# Patient Record
Sex: Female | Born: 1995 | Race: Black or African American | Hispanic: No | Marital: Single | Smoking: Current some day smoker
Health system: Southern US, Community
[De-identification: ages and names within clinical notes are randomized; demographics above are authoritative.]

## PROBLEM LIST (undated history)

## (undated) DIAGNOSIS — J302 Other seasonal allergic rhinitis: Secondary | ICD-10-CM

## (undated) DIAGNOSIS — F410 Panic disorder [episodic paroxysmal anxiety] without agoraphobia: Secondary | ICD-10-CM

## (undated) DIAGNOSIS — M419 Scoliosis, unspecified: Secondary | ICD-10-CM

## (undated) DIAGNOSIS — Z7289 Other problems related to lifestyle: Secondary | ICD-10-CM

---
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
## 2017-10-24 ENCOUNTER — Other Ambulatory Visit: Payer: Self-pay

## 2017-10-24 ENCOUNTER — Ambulatory Visit (HOSPITAL_COMMUNITY)
Admission: EM | Admit: 2017-10-24 | Discharge: 2017-10-24 | Disposition: A | Discharge status: Home / Self Care | Payer: BLUE CROSS/BLUE SHIELD | Attending: Family Medicine | Admitting: Family Medicine

## 2017-10-24 ENCOUNTER — Encounter (HOSPITAL_COMMUNITY): Payer: Self-pay | Admitting: Emergency Medicine

## 2017-10-24 DIAGNOSIS — J029 Acute pharyngitis, unspecified: Secondary | ICD-10-CM

## 2017-10-24 DIAGNOSIS — H9202 Otalgia, left ear: Secondary | ICD-10-CM

## 2017-10-24 MED ORDER — AMOXICILLIN 875 MG PO TABS: 875.0000 mg | ORAL_TABLET | Freq: Two times a day (BID) | ORAL | 0 refills | Status: AC

## 2017-10-24 NOTE — ED Provider Notes (Signed)
[facility]   [id number] 10/24/17 Arrival Time: 0906  ASSESSMENT & PLAN:  1. Left ear pain   2. Sore throat    Possible early OM. Meds ordered this encounter  Medications  . amoxicillin (AMOXIL) 875 MG tablet    Sig: Take 1 tablet (875 mg total) by mouth 2 (two) times daily for 10 days.    Dispense:  20 tablet    Refill:  0   OTC symptom care as needed. Ensure adequate fluid intake and rest. May f/u with PCP or here as needed.  Reviewed expectations re: course of current medical issues. Questions answered. Outlined signs and symptoms indicating need for more acute intervention. Patient verbalized understanding. After Visit Summary given.   SUBJECTIVE: History from: patient.  Alisha Washington is a 22 y.o. female who presents with complaint of left otalgia; without drainage; without bleeding. Onset gradual, over the past 4-5 days. Mild congestion. Recent cold symptoms: minimal. Fever: no. Overall normal PO intake without n/v. Sick contacts: no. No significant hearing changes reported. No ear trauma. OTC treatment: ear drops without much help.  Social History   Tobacco Use  Smoking Status Current Every Day Smoker  Smokeless Tobacco Never Used    ROS: As per HPI.   OBJECTIVE:  Vitals:   10/24/17 1016  BP: 121/83  Pulse: 83  Resp: 18  Temp: 98.6 F (37 C)  TempSrc: Oral  SpO2: 98%     General appearance: alert; appears fatigued Ear Canal: normal TM: left: mildly erythematous and bulging Throat: irritated and inflammed Neck: supple without LAD Lungs: unlabored respirations, symmetrical air entry; cough: absent; no respiratory distress Skin: warm and dry Psychological: alert and cooperative; normal mood and affect  No Known Allergies  Past Medical History:  Diagnosis Date  . Deliberate self-cutting   . Panic attacks   . Scoliosis   . Seasonal allergies    Family History  Problem Relation Age of Onset  . Diabetes Father    Social History    Socioeconomic History  . Marital status: Single    Spouse name: Not on file  . Number of children: Not on file  . Years of education: Not on file  . Highest education level: Not on file  Occupational History  . Not on file  Social Needs  . Financial resource strain: Not on file  . Food insecurity:    Worry: Not on file    Inability: Not on file  . Transportation needs:    Medical: Not on file    Non-medical: Not on file  Tobacco Use  . Smoking status: Current Every Day Smoker  . Smokeless tobacco: Never Used  Substance and Sexual Activity  . Alcohol use: No  . Drug use: No  . Sexual activity: Not on file  Lifestyle  . Physical activity:    Days per week: Not on file    Minutes per session: Not on file  . Stress: Not on file  Relationships  . Social connections:    Talks on phone: Not on file    Gets together: Not on file    Attends religious service: Not on file    Active member of club or organization: Not on file    Attends meetings of clubs or organizations: Not on file    Relationship status: Not on file  . Intimate partner violence:    Fear of current or ex partner: Not on file    Emotionally abused: Not on file  Physically abused: Not on file    Forced sexual activity: Not on file  Other Topics Concern  . Not on file  Social History Narrative  . Not on file            Mardella Layman, MD 10/24/17 1031

## 2017-10-24 NOTE — ED Triage Notes (Signed)
Left ear pain and throat pain for 4-5 days.  Unknown if any fever

## 2020-10-06 ENCOUNTER — Emergency Department (HOSPITAL_COMMUNITY): Payer: 59

## 2020-10-06 ENCOUNTER — Encounter (HOSPITAL_COMMUNITY): Payer: Self-pay | Admitting: Emergency Medicine

## 2020-10-06 ENCOUNTER — Emergency Department (HOSPITAL_COMMUNITY)
Admission: EM | Admit: 2020-10-06 | Discharge: 2020-10-06 | Disposition: A | Discharge status: Home / Self Care | Payer: 59 | Attending: Emergency Medicine | Admitting: Emergency Medicine

## 2020-10-06 ENCOUNTER — Other Ambulatory Visit: Payer: Self-pay

## 2020-10-06 DIAGNOSIS — F17200 Nicotine dependence, unspecified, uncomplicated: Secondary | ICD-10-CM | POA: Insufficient documentation

## 2020-10-06 DIAGNOSIS — R079 Chest pain, unspecified: Secondary | ICD-10-CM | POA: Diagnosis present

## 2020-10-06 LAB — BASIC METABOLIC PANEL
Anion gap: 10 (ref 5–15)
BUN: 10 mg/dL (ref 6–20)
CO2: 22 mmol/L (ref 22–32)
Calcium: 9.5 mg/dL (ref 8.9–10.3)
Chloride: 105 mmol/L (ref 98–111)
Creatinine, Ser: 0.41 mg/dL — ABNORMAL LOW (ref 0.44–1.00)
GFR, Estimated: >60 mL/min (ref >60)
Glucose, Bld: 113 mg/dL — ABNORMAL HIGH (ref 70–99)
Potassium: 3.9 mmol/L (ref 3.5–5.1)
Sodium: 137 mmol/L (ref 135–145)

## 2020-10-06 LAB — CBC WITH DIFFERENTIAL/PLATELET
Abs Immature Granulocytes: 0.03 10*3/uL (ref 0.00–0.07)
Basophils Absolute: 0.1 10*3/uL (ref 0.0–0.1)
Basophils Relative: 1 %
Eosinophils Absolute: 0.2 10*3/uL (ref 0.0–0.5)
Eosinophils Relative: 2 %
HCT: 41.6 % (ref 36.0–46.0)
Hemoglobin: 13.8 g/dL (ref 12.0–15.0)
Immature Granulocytes: 0 %
Lymphocytes Relative: 42 %
Lymphs Abs: 4.4 10*3/uL — ABNORMAL HIGH (ref 0.7–4.0)
MCH: 29.4 pg (ref 26.0–34.0)
MCHC: 33.2 g/dL (ref 30.0–36.0)
MCV: 88.5 fL (ref 80.0–100.0)
Monocytes Absolute: 0.9 10*3/uL (ref 0.1–1.0)
Monocytes Relative: 8 %
Neutro Abs: 4.9 10*3/uL (ref 1.7–7.7)
Neutrophils Relative %: 47 %
Platelets: 311 10*3/uL (ref 150–400)
RBC: 4.7 MIL/uL (ref 3.87–5.11)
RDW: 12.6 % (ref 11.5–15.5)
WBC: 10.4 10*3/uL (ref 4.0–10.5)
nRBC: 0 % (ref 0.0–0.2)

## 2020-10-06 LAB — I-STAT BETA HCG BLOOD, ED (MC, WL, AP ONLY): I-stat hCG, quantitative: <5 m[IU]/mL (ref <5)

## 2020-10-06 LAB — TROPONIN I (HIGH SENSITIVITY): Troponin I (High Sensitivity): 3 ng/L (ref <18)

## 2020-10-06 NOTE — ED Provider Notes (Signed)
[facility] EMERGENCY DEPARTMENT Provider Note   CSN: [id number] Arrival date & time: 10/06/20  9518     History Chief Complaint  Patient presents with   Chest Pain    Alisha Washington is a 25 y.o. female.  PMH of ETOH use disorder, depression  Patient presents to ED with complaints of chest pain.  She started having them on Saturday. She states that the pains are in the center of her chest and radiate to throat. She describes it as pressure. They last a couple of seconds and then improves. They are intermittent. They are worse when she lays down. She is at rest when she has them. She is not having chest pain while I am speaking with her, she says that she last felt it around 0600. She admits to eating spicy foods last night. She denies it hurting when she takes a deep breath. She denies dizziness, shortness of breath, nausea, vomiting, back pain, abdominal pain. She is not on birth control. She does not have a PCP.   Chest Pain Associated symptoms: no abdominal pain, no cough, no diaphoresis, no dizziness, no fatigue, no fever, no headache, no nausea, no palpitations, no shortness of breath, no vomiting and no weakness       Past Medical History:  Diagnosis Date   Deliberate self-cutting    Panic attacks    Scoliosis    Seasonal allergies     Patient Active Problem List   Diagnosis Date Noted   Severe recurrent major depression without psychotic features (HCC) 11/21/2016   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   ODD (oppositional defiant disorder) 03/19/2015   Alcohol abuse 03/19/2015    History reviewed. No pertinent surgical history.   OB History   No obstetric history on file.     Family History  Problem Relation Age of Onset   Diabetes Father     Social History   Tobacco Use   Smoking status: Every Day   Smokeless tobacco: Never  Substance Use Topics   Alcohol use: No   Drug use: No    Home Medications Prior to  Admission medications   Medication Sig Start Date End Date Taking? Authorizing Provider  Pseudoeph-Doxylamine-DM-APAP (NYQUIL PO) Take by mouth.    [provider]    Allergies    Patient has no known allergies.  Review of Systems   Review of Systems  Constitutional:  Negative for chills, diaphoresis, fatigue and fever.  Eyes:  Negative for visual disturbance.  Respiratory:  Positive for chest tightness. Negative for cough and shortness of breath.   Cardiovascular:  Positive for chest pain. Negative for palpitations and leg swelling.  Gastrointestinal:  Negative for abdominal pain, constipation, diarrhea, nausea and vomiting.  Skin:  Negative for rash and wound.  Neurological:  Negative for dizziness, syncope, weakness, light-headedness and headaches.  All other systems reviewed and are negative.  Physical Exam Updated Vital Signs BP 120/78   Pulse 83   Temp 98.2 F (36.8 C) (Oral)   Resp 12   SpO2 100%   Physical Exam Vitals and nursing note reviewed.  Constitutional:      General: She is not in acute distress.    Appearance: Normal appearance. She is well-developed. She is not ill-appearing, toxic-appearing or diaphoretic.  HENT:     Head: Normocephalic and atraumatic.     Mouth/Throat:     Mouth: Mucous membranes are moist.     Pharynx: Oropharynx is clear. No oropharyngeal exudate  or posterior oropharyngeal erythema.  Eyes:     General: No scleral icterus.       Right eye: No discharge.        Left eye: No discharge.     Conjunctiva/sclera: Conjunctivae normal.  Cardiovascular:     Rate and Rhythm: Normal rate and regular rhythm.     Pulses: Normal pulses.     Heart sounds: Normal heart sounds, S1 normal and S2 normal. No murmur heard.   No friction rub. No gallop.  Pulmonary:     Effort: Pulmonary effort is normal. No respiratory distress.     Breath sounds: Normal breath sounds. No wheezing, rhonchi or rales.  Chest:     Chest wall: No tenderness.   Abdominal:     General: Abdomen is flat. There is no distension.     Palpations: Abdomen is soft. There is no pulsatile mass.     Tenderness: There is no abdominal tenderness. There is no guarding or rebound.  Musculoskeletal:     Right lower leg: No edema.     Left lower leg: No edema.  Skin:    General: Skin is warm and dry.     Coloration: Skin is not jaundiced.     Findings: No bruising, erythema, lesion or rash.  Neurological:     General: No focal deficit present.     Mental Status: She is alert and oriented to person, place, and time.  Psychiatric:        Mood and Affect: Mood normal.        Behavior: Behavior normal.    ED Results / Procedures / Treatments   Labs (all labs ordered are listed, but only abnormal results are displayed) Labs Reviewed  CBC WITH DIFFERENTIAL/PLATELET - Abnormal; Notable for the following components:      Result Value   Lymphs Abs 4.4 (*)    All other components within normal limits  BASIC METABOLIC PANEL - Abnormal; Notable for the following components:   Glucose, Bld 113 (*)    Creatinine, Ser 0.41 (*)    All other components within normal limits  I-STAT BETA HCG BLOOD, ED ([facility], [facility], [facility] ONLY)  TROPONIN I (HIGH SENSITIVITY)    EKG EKG Interpretation  Date/Time:  Monday October 06 2020 02:24:49 EDT Ventricular Rate:  84 PR Interval:  168 QRS Duration: 86 QT Interval:  372 QTC Calculation: 439 R Axis:   71 Text Interpretation: Normal sinus rhythm Normal ECG When compared with ECG of 06/29/2014, No significant change was found Confirmed by Dione Booze ([id number]) on 10/06/2020 2:48:06 AM  Radiology DG Chest 2 View  Result Date: 10/06/2020 CLINICAL DATA:  Chest pain. EXAM: CHEST - 2 VIEW COMPARISON:  Chest radiograph dated 06/29/2014. FINDINGS: The heart size and mediastinal contours are within normal limits. Both lungs are clear. The visualized skeletal structures are unremarkable. IMPRESSION: No active cardiopulmonary disease.  Electronically Signed   By: Elgie Collard M.D.   On: 10/06/2020 02:49    Procedures Procedures   Medications Ordered in ED Medications - No data to display  ED Course  I have reviewed the triage vital signs and the nursing notes.  Pertinent labs & imaging results that were available during my care of the patient were reviewed by me and considered in my medical decision making (see chart for details).    MDM Rules/Calculators/A&P  Patient presents to the ED with complaints of intermittent chest pain for 2 days. Well appearing, Appears to be in no acute distress, vitals are normal. Afebrile. Patient is not currently having chest pain. Exam is largely unremarkable.    I personally reviewed all laboratory work and imaging. Metabolic panel without any acute abnormality specifically kidney function within normal limits and no significant electrolyte abnormalities.  CBC without leukocytosis or significant anemia.  Reviewed Imaging studies: EKG with no evidence of ACS. Chest XR with no evidence of cardiomegaly, pneumonia, or pneumothorax. Patient is not on birth control and has no risk factors for PE. PERC 0. No need for further imaging.   After reviewing previous history, current presentation, labs and imaging, I have low suspicion for an emergent cause of chest pain. I suspect that patient may be having chest pain as a result of GERD. I am referring her to the [facility] because she does not have a PCP.   Portions of this note were generated with Scientist, clinical (histocompatibility and immunogenetics). Dictation errors may occur despite best attempts at proofreading.   Final Clinical Impression(s) / ED Diagnoses Final diagnoses:  Nonspecific chest pain    Rx / DC Orders ED Discharge Orders     None        Claudie Leach, PA-C 10/06/20 1156    Cheryll Cockayne, MD 10/08/20 [phone]

## 2020-10-06 NOTE — ED Triage Notes (Signed)
Pt c/o generalized chest heaviness since last night.  No other symptoms

## 2020-10-06 NOTE — ED Provider Notes (Signed)
Emergency Medicine Provider Triage Evaluation Note  Alisha Washington , a 25 y.o. female  was evaluated in triage.  Pt complains of chest pain.  Began yesterday but worse today.  Described as heaviness in chest, worse with laying down and swallowing.  Denies feeling of burning in the chest/throat.  No cardiac history, former smoker.  Not currently on exogenous estrogens.  No hx of DVT or PE.  Review of Systems  Positive: Chest pain Negative: SOB, fever, cough  Physical Exam  BP (!) 142/100 (BP Location: Right Arm)   Pulse 92   Temp 98.5 F (36.9 C) (Oral)   Resp 16   SpO2 100%   Gen:   Awake, no distress   Resp:  Normal effort  MSK:   Moves extremities without difficulty  Other:    Medical Decision Making  Medically screening exam initiated at 2:16 AM.  Appropriate orders placed.  Alisha Washington was informed that the remainder of the evaluation will be completed by another provider, this initial triage assessment does not replace that evaluation, and the importance of remaining in the ED until their evaluation is complete.  VSS in triage, no tachycardia or hypoxia.  No real risk factors for PE.  Will obtain EKG, labs, CXR.   Garlon Hatchet, PA-C 10/06/20 0225    Nira Conn, MD 10/06/20 0600

## 2020-10-06 NOTE — Discharge Instructions (Addendum)
You were seen in the ED for chest pain.  You labs and imaging were not suggestive of a serious cause of chest pain.  Please make an appointment with the clinic if you continue to have symptoms for further work up until you get established with a PCP.

## 2020-10-09 ENCOUNTER — Ambulatory Visit: Payer: Self-pay | Admitting: Family Medicine

## 2021-03-06 ENCOUNTER — Emergency Department (HOSPITAL_COMMUNITY)
Admission: EM | Admit: 2021-03-06 | Discharge: 2021-03-06 | Disposition: A | Discharge status: Home / Self Care | Payer: Medicaid Other | Attending: Emergency Medicine | Admitting: Emergency Medicine

## 2021-03-06 DIAGNOSIS — K047 Periapical abscess without sinus: Secondary | ICD-10-CM | POA: Insufficient documentation

## 2021-03-06 DIAGNOSIS — K0889 Other specified disorders of teeth and supporting structures: Secondary | ICD-10-CM

## 2021-03-06 DIAGNOSIS — R030 Elevated blood-pressure reading, without diagnosis of hypertension: Secondary | ICD-10-CM

## 2021-03-06 MED ORDER — AMOXICILLIN-POT CLAVULANATE 875-125 MG PO TABS
1.0000 | ORAL_TABLET | Freq: Once | ORAL | Status: AC
  Administered 2021-03-06: 1 via ORAL
  Filled 2021-03-06: qty 1

## 2021-03-06 MED ORDER — FAMOTIDINE 20 MG PO TABS
40.0000 mg | ORAL_TABLET | Freq: Once | ORAL | Status: AC
  Administered 2021-03-06: 40 mg via ORAL
  Filled 2021-03-06: qty 2

## 2021-03-06 MED ORDER — ACETAMINOPHEN 500 MG PO TABS
1000.0000 mg | ORAL_TABLET | Freq: Once | ORAL | Status: AC
Start: 2021-03-06 — End: 2021-03-06
  Administered 2021-03-06: 1000 mg via ORAL
  Filled 2021-03-06: qty 2

## 2021-03-06 MED ORDER — DIPHENHYDRAMINE HCL 25 MG PO CAPS
25.0000 mg | ORAL_CAPSULE | Freq: Once | ORAL | Status: AC
  Administered 2021-03-06: 25 mg via ORAL
  Filled 2021-03-06: qty 1

## 2021-03-06 MED ORDER — IBUPROFEN 400 MG PO TABS
400.0000 mg | ORAL_TABLET | Freq: Once | ORAL | Status: AC
  Administered 2021-03-06: 400 mg via ORAL
  Filled 2021-03-06: qty 1

## 2021-03-06 MED ORDER — AMOXICILLIN-POT CLAVULANATE 875-125 MG PO TABS: 1.0000 | ORAL_TABLET | Freq: Two times a day (BID) | ORAL | 0 refills | Status: DC

## 2021-03-06 NOTE — ED Triage Notes (Signed)
Pt has appt for root canal 2/21. Advises she took benadryl approx 1400, no improvement in swelling

## 2021-03-06 NOTE — ED Triage Notes (Signed)
Pt c/o intermittent dental pain since last May, cavity on R front tooth. Using orajel since yesterday, states she feels she might be allergic. Swelling to top lip, airway intact, denies SHOB, itchiness, no other complaints.

## 2021-03-06 NOTE — ED Provider Triage Note (Signed)
Emergency Medicine Provider Triage Evaluation Note  Alisha Washington , a 26 y.o. female  was evaluated in triage.  Pt complains of swelling of the upper lip.  Patient started taking Orajel secondary to dental cavity yesterday.  She woke up this morning and her upper lip was significantly swollen per the patient.  She took Benadryl with little relief.  No trouble swallowing.  No trouble breathing.  Review of Systems  Positive:  Negative: See above   Physical Exam  BP (!) 155/92 (BP Location: Left Arm)    Pulse (!) 104    Temp 100.2 F (37.9 C) (Oral)    Resp 16    SpO2 97%  Gen:   Awake, no distress   Resp:  Normal effort  MSK:   Moves extremities without difficulty  Other:  Talking in complete sentences.  Airway intact.  Upper lip is mildly swollen.  Medical Decision Making  Medically screening exam initiated at 7:12 PM.  Appropriate orders placed.  Alisha Washington was informed that the remainder of the evaluation will be completed by another provider, this initial triage assessment does not replace that evaluation, and the importance of remaining in the ED until their evaluation is complete.     Alisha Washington [place], [place] 03/06/21 1914

## 2021-03-06 NOTE — Discharge Instructions (Signed)
It was our pleasure to provide your ER care today - we hope that you feel better.  Take augmentin (antibiotic) as prescribed. Warm compresses to sore area. Take acetaminophen or ibuprofen as need for pain.    Follow up with your dentist this coming Monday or Tuesday.  Your blood pressure is high today - follow up with primary care doctor in the next 1-2 weeks.   Return to ER if worse, new symptoms, severe or intractable pain, increased swelling/spreading redness, high fevers, or other concern.

## 2021-03-06 NOTE — ED Provider Notes (Signed)
[facility] EMERGENCY DEPARTMENT Provider Note   CSN: [id number] Arrival date & time: 03/06/21  1828     History  Chief Complaint  Patient presents with   Allergic Reaction   Dental Pain    Alisha Washington is a 26 y.o. female.  Patient c/o dental pain, and now swelling over that same area. Patient indicates hx pain/cavity to tooth #8, and indicates having tooth pain in past week, moderate, dull. States tried orajel to area in past day, and  then states area seemed more swollen - pt was concerned about possible allergic rxn to orajel. Area is painful, not itchy, just above tooth 8 and below right nare externally. No redness to area. No fever/chills, although temp in ED 100.2.    The history is provided by the patient and medical records.  Allergic Reaction Presenting symptoms: no difficulty swallowing and no rash   Dental Pain Associated symptoms: no fever, no headaches and no neck pain       Home Medications Prior to Admission medications   Medication Sig Start Date End Date Taking? Authorizing Provider  Pseudoeph-Doxylamine-DM-APAP (NYQUIL PO) Take by mouth.    [provider]      Allergies    Patient has no known allergies.    Review of Systems   Review of Systems  Constitutional:  Negative for chills and fever.  HENT:  Positive for dental problem. Negative for sore throat and trouble swallowing.   Eyes:  Negative for pain and redness.  Respiratory:  Negative for shortness of breath.   Gastrointestinal:  Negative for vomiting.  Musculoskeletal:  Negative for neck pain.  Skin:  Negative for rash.  Neurological:  Negative for headaches.  Hematological:  Does not bruise/bleed easily.  Psychiatric/Behavioral:  Negative for confusion.    Physical Exam Updated Vital Signs BP (!) 155/92 (BP Location: Left Arm)    Pulse (!) 104    Temp 100.2 F (37.9 C) (Oral)    Resp 16    SpO2 97%  Physical Exam Vitals and nursing note reviewed.   Constitutional:      Appearance: Normal appearance. She is well-developed.  HENT:     Head: Atraumatic.     Nose: Nose normal.     Mouth/Throat:     Mouth: Mucous membranes are moist.     Comments: Mild gum swelling above tooth #8 ?dental abscess. No fluctuance noted to area, or to face. No erythema to face.  No angioedema. Pharynx normal.  Eyes:     General: No scleral icterus.    Conjunctiva/sclera: Conjunctivae normal.  Neck:     Trachea: No tracheal deviation.  Cardiovascular:     Rate and Rhythm: Normal rate.     Pulses: Normal pulses.  Pulmonary:     Effort: Pulmonary effort is normal. No respiratory distress.  Genitourinary:    Comments: No cva tenderness.  Musculoskeletal:        General: No swelling.     Cervical back: Normal range of motion and neck supple. No rigidity. No muscular tenderness.  Lymphadenopathy:     Cervical: No cervical adenopathy.  Skin:    General: Skin is warm and dry.     Findings: No rash.  Neurological:     Mental Status: She is alert.     Comments: Alert, speech normal.   Psychiatric:        Mood and Affect: Mood normal.    ED Results / Procedures / Treatments   Labs (all labs  ordered are listed, but only abnormal results are displayed) Labs Reviewed - No data to display  EKG None  Radiology No results found.  Procedures Procedures    Medications Ordered in ED Medications  amoxicillin-clavulanate (AUGMENTIN) 875-125 MG per tablet 1 tablet (has no administration in time range)  ibuprofen (ADVIL) tablet 400 mg (has no administration in time range)  acetaminophen (TYLENOL) tablet 1,000 mg (has no administration in time range)  diphenhydrAMINE (BENADRYL) capsule 25 mg (25 mg Oral Given 03/06/21 1926)  famotidine (PEPCID) tablet 40 mg (40 mg Oral Given 03/06/21 1926)    ED Course/ Medical Decision Making/ A&P                           Medical Decision Making Problems Addressed: Dental abscess: acute illness or injury with  systemic symptoms    Details: w low grade fever Elevated blood pressure reading: acute illness or injury Pain, dental: acute illness or injury  Risk OTC drugs. Prescription drug management.   Confirmed nkda w pt.   Reviewed nursing notes and prior charts for additional history. Prior external charts/notes, imaging/lab reviewed.  No meds prior to arrival except benadryl.   Suspect dental abscess.   Augmentin po. Ibuprofen po, acetaminophen po.  Pt currently appears stable for d/c.   Pt indicates has local dentist - to f/u there Monday or Tuesday.  Return precautions provided.             Final Clinical Impression(s) / ED Diagnoses Final diagnoses:  None    Rx / DC Orders ED Discharge Orders     None         Cathren Laine, MD 03/06/21 2014

## 2021-03-26 ENCOUNTER — Ambulatory Visit (HOSPITAL_COMMUNITY)
Admission: EM | Admit: 2021-03-26 | Discharge: 2021-03-26 | Disposition: A | Discharge status: Home / Self Care | Payer: 59

## 2021-03-26 DIAGNOSIS — F332 Major depressive disorder, recurrent severe without psychotic features: Secondary | ICD-10-CM | POA: Diagnosis not present

## 2021-03-26 NOTE — ED Triage Notes (Signed)
Pt presents to [facility] requesting an initial assessment. Pt states that she has been having paranoia for the past year on and off but it has gotten worse lately. Pt states that she was diagnosed with depression and ODD when she was 21 but has not been on any medication. Pt states that she does not have a PCP at this time but would like to get set up with a psychiatrist. Pt states that she often times hears whispers but cannot make out what they are saying and she sees "tall figures". Pt states that she is not having any hallucinations at this time. Pt denies SI/HI and AVH at this time. Pt is routine.

## 2021-03-26 NOTE — ED Notes (Signed)
Pt given avs and follow up instructions.

## 2021-03-26 NOTE — Discharge Instructions (Addendum)

## 2021-03-26 NOTE — ED Provider Notes (Signed)
Behavioral Health Urgent Care Medical Screening Exam  Patient Name: Alisha Washington MRN: [id number] Date of Evaluation: 03/26/21 Chief Complaint:   Diagnosis:  Final diagnoses:  Severe recurrent major depression without psychotic features ([place])    History of Present illness: Byrl Washington is a 26 y.o. female. Patient presents voluntarily to [facility] behavioral health for walk-in assessment.    Alisha Washington reports she has been "dealing with a lot of stuff for a while, sometimes I feel numb."  She shares recent stressors include "work bothers me because I am not where I wish I was."  Patient was enrolled in college however stopped at age 60 and has not returned.  She is employed as a Scientist, water quality in [employer].  She has been diagnosed with major depressive disorder, also diagnosed with alcohol abuse.  She is not currently linked with outpatient psychiatry.  She reports she last had medication management approximately 4 years ago, at that time she was prescribed Prozac.  She reports she was not compliant with medication.  She endorses history of inpatient psychiatric hospital stations, most recent hospitalization at [facility] behavioral health in 2018.  Family psychiatric history is unknown.  Patient reports she believes both of her parents may have suffered from depression.  Patient is assessed face-to-face by nurse practitioner.  She is seated in assessment area, no acute distress.  She is alert and oriented, pleasant and cooperative during assessment.   She presents with depressed mood, congruent affect. She denies suicidal and homicidal ideations.  She denies history of suicide attempts. She contracts verbally for safety with this Probation officer. Patient endorses history of nonsuicidal self-harm by scratching with fingernails.  She reports last episode of scratching with fingernails approximately 2 days ago.  She has normal speech and behavior.  She denies both auditory and visual hallucinations  currently.  She reports she has seen "figures out of the corner of my eye or hear whispers."  Last saw figures out of the corner of her eye 2 days ago.  Patient is able to converse coherently with goal-directed thoughts and no distractibility or preoccupation.  She denies paranoia.  Objectively there is no evidence of psychosis/mania or delusional thinking.  Patient resides in [place], she denies access to weapons.  Patient endorses increased sleep and average appetite.  Patient endorses alcohol use, approximately 1 glass of wine per month.  She denies substance use aside from alcohol.  Patient offered support and encouragement.  She declines anyone to contact for collateral information at this time.  Patient educated and verbalizes understanding of mental health resources and other crisis services in the community. She is instructed to call 911 and present to the nearest emergency room should she experience any suicidal/homicidal ideation, auditory/visual/hallucinations, or detrimental worsening of her mental health condition.     Psychiatric Specialty Exam  Presentation  General Appearance:Appropriate for Environment; Casual  Eye Contact:Fair  Speech:Clear and Coherent; Normal Rate  Speech Volume:Normal  Handedness:Right   Mood and Affect  Mood:Depressed  Affect:Congruent; Depressed   Thought Process  Thought Processes:Goal Directed; Linear; Coherent  Descriptions of Associations:Intact  Orientation:Full (Time, Place and Person)  Thought Content:Logical; WDL    Hallucinations:None  Ideas of Reference:None  Suicidal Thoughts:No  Homicidal Thoughts:No   Sensorium  Memory:Immediate Good; Recent Good  Judgment:Good  Insight:Fair   Executive Functions  Concentration:Good  Attention Span:Good  Recall:Good  Fund of Knowledge:Good  Language:Good   Psychomotor Activity  Psychomotor Activity:Normal   Assets  Assets:Communication Skills; Desire for  Improvement; Financial  Resources/Insurance; Housing; Intimacy; Leisure Time; Physical Health; Resilience   Sleep  Sleep:Fair  Number of hours: No data recorded  No data recorded  Physical Exam: Physical Exam Vitals and nursing note reviewed.  Constitutional:      Appearance: Normal appearance. She is well-developed.  HENT:     Head: Normocephalic and atraumatic.     Nose: Nose normal.  Cardiovascular:     Rate and Rhythm: Normal rate.  Pulmonary:     Effort: Pulmonary effort is normal.  Musculoskeletal:        General: Normal range of motion.     Cervical back: Normal range of motion.  Neurological:     Mental Status: She is alert and oriented to person, place, and time.  Psychiatric:        Attention and Perception: Attention and perception normal.        Mood and Affect: Affect normal. Mood is depressed.        Speech: Speech normal.        Behavior: Behavior normal. Behavior is cooperative.        Thought Content: Thought content normal.        Cognition and Memory: Cognition and memory normal.   Review of Systems  Constitutional: Negative.   HENT: Negative.    Eyes: Negative.   Respiratory: Negative.    Cardiovascular: Negative.   Gastrointestinal: Negative.   Genitourinary: Negative.   Musculoskeletal: Negative.   Skin: Negative.   Neurological: Negative.   Endo/Heme/Allergies: Negative.   Psychiatric/Behavioral:  Positive for depression.   Blood pressure 128/79, pulse 100, temperature 98.5 F (36.9 C), temperature source Oral, resp. rate 18, SpO2 96 %. There is no height or weight on file to calculate BMI.  Musculoskeletal: Strength & Muscle Tone: within normal limits Gait & Station: normal Patient leans: N/A   [facility] MSE Discharge Disposition for Follow up and Recommendations: Based on my evaluation the patient does not appear to have an emergency medical condition and can be discharged with resources and follow up care in outpatient services for  Medication Management, Partial Hospitalization Program, and Individual Therapy Patient reviewed with Dr. Serafina Mitchell. Follow-up with outpatient psychiatry, resources provided. A referral has been made on your behalf for intensive outpatient program.  Program administrator will reach out to you with details surrounding initial assessment.   Lucky Rathke, FNP 03/26/2021, 6:16 PM

## 2021-03-27 ENCOUNTER — Telehealth (HOSPITAL_COMMUNITY): Payer: Self-pay | Admitting: Licensed Clinical Social Worker

## 2021-04-01 ENCOUNTER — Telehealth (HOSPITAL_COMMUNITY): Payer: Self-pay | Admitting: Licensed Clinical Social Worker

## 2021-04-01 ENCOUNTER — Other Ambulatory Visit (HOSPITAL_COMMUNITY): Payer: 59 | Attending: Psychiatry | Admitting: Licensed Clinical Social Worker

## 2021-04-01 ENCOUNTER — Other Ambulatory Visit: Payer: Self-pay

## 2021-04-06 ENCOUNTER — Encounter (HOSPITAL_COMMUNITY): Payer: Self-pay | Admitting: Registered Nurse

## 2021-04-06 ENCOUNTER — Other Ambulatory Visit: Payer: Self-pay

## 2021-04-06 ENCOUNTER — Inpatient Hospital Stay (HOSPITAL_COMMUNITY)
Admission: AD | Admit: 2021-04-06 | Discharge: 2021-04-13 | DRG: 885 | Disposition: A | Discharge status: Home / Self Care | Payer: 59 | Source: Intra-hospital | Attending: Psychiatry | Admitting: Psychiatry

## 2021-04-06 ENCOUNTER — Other Ambulatory Visit (HOSPITAL_COMMUNITY): Payer: 59 | Admitting: Licensed Clinical Social Worker

## 2021-04-06 ENCOUNTER — Ambulatory Visit (HOSPITAL_COMMUNITY)
Admission: EM | Admit: 2021-04-06 | Discharge: 2021-04-06 | Disposition: A | Discharge status: Other Acute Inpatient Hospital | Payer: 59 | Attending: Registered Nurse | Admitting: Registered Nurse

## 2021-04-06 DIAGNOSIS — Z833 Family history of diabetes mellitus: Secondary | ICD-10-CM

## 2021-04-06 DIAGNOSIS — R45851 Suicidal ideations: Secondary | ICD-10-CM | POA: Diagnosis not present

## 2021-04-06 DIAGNOSIS — F411 Generalized anxiety disorder: Secondary | ICD-10-CM | POA: Diagnosis present

## 2021-04-06 DIAGNOSIS — Z7251 High risk heterosexual behavior: Secondary | ICD-10-CM | POA: Insufficient documentation

## 2021-04-06 DIAGNOSIS — F913 Oppositional defiant disorder: Secondary | ICD-10-CM | POA: Diagnosis not present

## 2021-04-06 DIAGNOSIS — Z79899 Other long term (current) drug therapy: Secondary | ICD-10-CM

## 2021-04-06 DIAGNOSIS — F332 Major depressive disorder, recurrent severe without psychotic features: Secondary | ICD-10-CM | POA: Diagnosis present

## 2021-04-06 DIAGNOSIS — Z9152 Personal history of nonsuicidal self-harm: Secondary | ICD-10-CM | POA: Diagnosis not present

## 2021-04-06 DIAGNOSIS — F4310 Post-traumatic stress disorder, unspecified: Secondary | ICD-10-CM | POA: Diagnosis present

## 2021-04-06 DIAGNOSIS — Z20822 Contact with and (suspected) exposure to covid-19: Secondary | ICD-10-CM | POA: Diagnosis not present

## 2021-04-06 DIAGNOSIS — F32A Depression, unspecified: Secondary | ICD-10-CM | POA: Diagnosis not present

## 2021-04-06 LAB — LIPID PANEL
Cholesterol: 155 mg/dL (ref 0–200)
HDL: 61 mg/dL (ref >40)
LDL Cholesterol: 84 mg/dL (ref 0–99)
Total CHOL/HDL Ratio: 2.5 RATIO
Triglycerides: 48 mg/dL (ref <150)
VLDL: 10 mg/dL (ref 0–40)

## 2021-04-06 LAB — COMPREHENSIVE METABOLIC PANEL
ALT: 17 U/L (ref 0–44)
AST: 19 U/L (ref 15–41)
Albumin: 4.6 g/dL (ref 3.5–5.0)
Alkaline Phosphatase: 73 U/L (ref 38–126)
Anion gap: 14 (ref 5–15)
BUN: 8 mg/dL (ref 6–20)
CO2: 24 mmol/L (ref 22–32)
Calcium: 9.8 mg/dL (ref 8.9–10.3)
Chloride: 103 mmol/L (ref 98–111)
Creatinine, Ser: 0.59 mg/dL (ref 0.44–1.00)
GFR, Estimated: >60 mL/min (ref >60)
Glucose, Bld: 86 mg/dL (ref 70–99)
Potassium: 3.5 mmol/L (ref 3.5–5.1)
Sodium: 141 mmol/L (ref 135–145)
Total Bilirubin: 1.2 mg/dL (ref 0.3–1.2)
Total Protein: 7.6 g/dL (ref 6.5–8.1)

## 2021-04-06 LAB — URINALYSIS, ROUTINE W REFLEX MICROSCOPIC
Bilirubin Urine: NEGATIVE
Glucose, UA: NEGATIVE mg/dL
Hgb urine dipstick: NEGATIVE
Ketones, ur: 20 mg/dL — AB
Leukocytes,Ua: NEGATIVE
Nitrite: NEGATIVE
Protein, ur: 30 mg/dL — AB
Specific Gravity, Urine: 1.031 — ABNORMAL HIGH (ref 1.005–1.030)
pH: 5 (ref 5.0–8.0)

## 2021-04-06 LAB — POCT URINE DRUG SCREEN - MANUAL ENTRY (I-SCREEN)
POC Amphetamine UR: NOT DETECTED
POC Buprenorphine (BUP): NOT DETECTED
POC Cocaine UR: NOT DETECTED
POC Marijuana UR: POSITIVE — AB
POC Methadone UR: NOT DETECTED
POC Methamphetamine UR: NOT DETECTED
POC Morphine: NOT DETECTED
POC Oxazepam (BZO): NOT DETECTED
POC Oxycodone UR: NOT DETECTED
POC Secobarbital (BAR): NOT DETECTED

## 2021-04-06 LAB — HEMOGLOBIN A1C
Hgb A1c MFr Bld: 5 % (ref 4.8–5.6)
Mean Plasma Glucose: 96.8 mg/dL

## 2021-04-06 LAB — CBC WITH DIFFERENTIAL/PLATELET
Abs Immature Granulocytes: 0.02 10*3/uL (ref 0.00–0.07)
Basophils Absolute: 0.1 10*3/uL (ref 0.0–0.1)
Basophils Relative: 1 %
Eosinophils Absolute: 0.1 10*3/uL (ref 0.0–0.5)
Eosinophils Relative: 1 %
HCT: 42.9 % (ref 36.0–46.0)
Hemoglobin: 14.3 g/dL (ref 12.0–15.0)
Immature Granulocytes: 0 %
Lymphocytes Relative: 31 %
Lymphs Abs: 2.7 10*3/uL (ref 0.7–4.0)
MCH: 28.4 pg (ref 26.0–34.0)
MCHC: 33.3 g/dL (ref 30.0–36.0)
MCV: 85.3 fL (ref 80.0–100.0)
Monocytes Absolute: 0.8 10*3/uL (ref 0.1–1.0)
Monocytes Relative: 9 %
Neutro Abs: 5.1 10*3/uL (ref 1.7–7.7)
Neutrophils Relative %: 58 %
Platelets: 312 10*3/uL (ref 150–400)
RBC: 5.03 MIL/uL (ref 3.87–5.11)
RDW: 12.3 % (ref 11.5–15.5)
WBC: 8.7 10*3/uL (ref 4.0–10.5)
nRBC: 0 % (ref 0.0–0.2)

## 2021-04-06 LAB — RESP PANEL BY RT-PCR (FLU A&B, COVID) ARPGX2
Influenza A by PCR: NEGATIVE
Influenza B by PCR: NEGATIVE
SARS Coronavirus 2 by RT PCR: NEGATIVE

## 2021-04-06 LAB — PREGNANCY, URINE: Preg Test, Ur: NEGATIVE

## 2021-04-06 LAB — ETHANOL: Alcohol, Ethyl (B): <10 mg/dL (ref <10)

## 2021-04-06 LAB — MAGNESIUM: Magnesium: 2.2 mg/dL (ref 1.7–2.4)

## 2021-04-06 LAB — POC SARS CORONAVIRUS 2 AG -  ED: SARS Coronavirus 2 Ag: NEGATIVE

## 2021-04-06 LAB — TSH: TSH: <0.01 u[IU]/mL — ABNORMAL LOW (ref 0.350–4.500)

## 2021-04-06 MED ORDER — ACETAMINOPHEN 325 MG PO TABS: 650.0000 mg | ORAL_TABLET | Freq: Four times a day (QID) | ORAL | Status: DC | PRN

## 2021-04-06 MED ORDER — MAGNESIUM HYDROXIDE 400 MG/5ML PO SUSP: 30.0000 mL | Freq: Every day | ORAL | Status: DC | PRN

## 2021-04-06 MED ORDER — HYDROXYZINE HCL 25 MG PO TABS
25.0000 mg | ORAL_TABLET | Freq: Three times a day (TID) | ORAL | Status: DC | PRN
Start: 2021-04-06 — End: 2021-04-13
  Administered 2021-04-10: 25 mg via ORAL
  Filled 2021-04-06: qty 1
  Filled 2021-04-06: qty 10

## 2021-04-06 MED ORDER — ALUM & MAG HYDROXIDE-SIMETH 200-200-20 MG/5ML PO SUSP: 30.0000 mL | ORAL | Status: DC | PRN

## 2021-04-06 MED ORDER — SERTRALINE HCL 25 MG PO TABS
25.0000 mg | ORAL_TABLET | Freq: Every day | ORAL | Status: DC
  Administered 2021-04-07: 25 mg via ORAL
  Filled 2021-04-06 (×3): qty 1

## 2021-04-06 MED ORDER — HYDROXYZINE HCL 25 MG PO TABS: 25.0000 mg | ORAL_TABLET | Freq: Three times a day (TID) | ORAL | Status: DC | PRN

## 2021-04-06 MED ORDER — TRAZODONE HCL 50 MG PO TABS: 50.0000 mg | ORAL_TABLET | Freq: Every evening | ORAL | Status: DC | PRN

## 2021-04-06 MED ORDER — SERTRALINE HCL 25 MG PO TABS
25.0000 mg | ORAL_TABLET | Freq: Every day | ORAL | Status: DC
  Administered 2021-04-06: 25 mg via ORAL
  Filled 2021-04-06: qty 1

## 2021-04-06 MED ORDER — ALUM & MAG HYDROXIDE-SIMETH 200-200-20 MG/5ML PO SUSP
30.0000 mL | ORAL | Status: DC | PRN
Start: 2021-04-06 — End: 2021-04-13

## 2021-04-06 NOTE — Discharge Instructions (Addendum)
Transfer to [facility] 
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
## 2021-04-06 NOTE — ED Notes (Signed)
Patient refused dinner.

## 2021-04-06 NOTE — ED Provider Notes (Signed)
FBC/OBS ASAP Discharge Summary  Date and Time: 04/06/2021 10:58 PM  Name: Alisha Washington  MRN:  [id number]   Discharge Diagnoses:  Final diagnoses:  Severe recurrent major depression without psychotic features [facility])  Suicidal ideation   Patient transferred to [place] for inpatient psychiatric treatment.   Alisha Washington, 26 y.o., female patient presents to [facility] sent by her counselor after complaints of suicidal ideation with plan and unable to contract for safety   Patient seen face to face by this provider, consulted with Dr. Ernie Hew; and chart reviewed on 04/06/21.  On evaluation Alisha Washington reports she was started on her partial hospitalization program and told counselor that she was having suicidal thoughts.  Patient reports she was then informed by her counselor that she needed to come here [facility]).  Patient states she is having suicidal thoughts with a plan to either cut herself or burned charcoal and breathing it in "because I heard that would kill you to."  Patient also reporting self harming behavior (cutting).  Reports she last cut this morning.  Patient has multiple superficial lacerations anterior and posterior lower forearm.  Reports cuts were made with a box cutter.  Patient denies prior suicide attempt but states she has had 1 psychiatric hospitalization when she was 21 years ago related to getting upset after argument with her boyfriend and hurting herself.  Patient reports she lives alone and she is employed at [employer].  During evaluation Alisha Washington is alert/oriented x 4; calm/cooperative; and mood is congruent with affect.  She does not appear to be responding to internal/external stimuli or delusional thoughts.  Patient denies psychosis, and paranoia; but continues to endorse suicidal ideation with plan and intent.  Patient answered question appropriately.   Recommended for inpatient psychiatric treatment.  Patient reports she is interested in starting medication.  Reports she  has been on Effexor, Lexapro, and Prozac in the past but did not take them long enough to see if any of them would actually work.  Meds ordered this encounter  Medications   acetaminophen (TYLENOL) tablet 650 mg   alum & mag hydroxide-simeth (MAALOX/MYLANTA) 200-200-20 MG/5ML suspension 30 mL   magnesium hydroxide (MILK OF MAGNESIA) suspension 30 mL   traZODone (DESYREL) tablet 50 mg   hydrOXYzine (ATARAX) tablet 25 mg   sertraline (ZOLOFT) tablet 25 mg    Recent Results (from the past 2160 hour(s))  Resp Panel by RT-PCR (Flu A&B, Covid) Nasopharyngeal Swab     Status: None   Collection Time: 04/06/21  1:44 PM   Specimen: Nasopharyngeal Swab; Nasopharyngeal(NP) swabs in vial transport medium  Result Value Ref Range   SARS Coronavirus 2 by RT PCR NEGATIVE NEGATIVE    Comment: (NOTE) SARS-CoV-2 target nucleic acids are NOT DETECTED.  The SARS-CoV-2 RNA is generally detectable in upper respiratory specimens during the acute phase of infection. The lowest concentration of SARS-CoV-2 viral copies this assay can detect is 138 copies/mL. A negative result does not preclude SARS-Cov-2 infection and should not be used as the sole basis for treatment or other patient management decisions. A negative result may occur with  improper specimen collection/handling, submission of specimen other than nasopharyngeal swab, presence of viral mutation(s) within the areas targeted by this assay, and inadequate number of viral copies(<138 copies/mL). A negative result must be combined with clinical observations, patient history, and epidemiological information. The expected result is Negative.  Fact Sheet for Patients:  EntrepreneurPulse.com.au  Fact Sheet for Healthcare Providers:  IncredibleEmployment.be  This test  is no t yet approved or cleared by the [place] and  has been authorized for detection and/or diagnosis of SARS-CoV-2 by FDA under an  Emergency Use Authorization (EUA). This EUA will remain  in effect (meaning this test can be used) for the duration of the COVID-19 declaration under Section 564(b)(1) of the Act, 21 U.S.C.section 360bbb-3(b)(1), unless the authorization is terminated  or revoked sooner.       Influenza A by PCR NEGATIVE NEGATIVE   Influenza B by PCR NEGATIVE NEGATIVE    Comment: (NOTE) The Xpert Xpress SARS-CoV-2/FLU/RSV plus assay is intended as an aid in the diagnosis of influenza from Nasopharyngeal swab specimens and should not be used as a sole basis for treatment. Nasal washings and aspirates are unacceptable for Xpert Xpress SARS-CoV-2/FLU/RSV testing.  Fact Sheet for Patients: EntrepreneurPulse.com.au  Fact Sheet for Healthcare Providers: IncredibleEmployment.be  This test is not yet approved or cleared by the [place] FDA and has been authorized for detection and/or diagnosis of SARS-CoV-2 by FDA under an Emergency Use Authorization (EUA). This EUA will remain in effect (meaning this test can be used) for the duration of the COVID-19 declaration under Section 564(b)(1) of the Act, 21 U.S.C. section 360bbb-3(b)(1), unless the authorization is terminated or revoked.  Performed at [facility], [place] [address] [place], [place] [address]   POC SARS Coronavirus 2 Ag-ED - Nasal Swab     Status: Normal   Collection Time: 04/06/21  1:44 PM  Result Value Ref Range   SARS Coronavirus 2 Ag Negative Negative  CBC with Differential/Platelet     Status: None   Collection Time: 04/06/21  1:47 PM  Result Value Ref Range   WBC 8.7 4.0 - 10.5 K/uL   RBC 5.03 3.87 - 5.11 MIL/uL   Hemoglobin 14.3 12.0 - 15.0 g/dL   HCT 42.9 36.0 - 46.0 %   MCV 85.3 80.0 - 100.0 fL   MCH 28.4 26.0 - 34.0 pg   MCHC 33.3 30.0 - 36.0 g/dL   RDW 12.3 11.5 - 15.5 %   Platelets 312 150 - 400 K/uL   nRBC 0.0 0.0 - 0.2 %   Neutrophils Relative % 58 %   Neutro Abs 5.1 1.7  - 7.7 K/uL   Lymphocytes Relative 31 %   Lymphs Abs 2.7 0.7 - 4.0 K/uL   Monocytes Relative 9 %   Monocytes Absolute 0.8 0.1 - 1.0 K/uL   Eosinophils Relative 1 %   Eosinophils Absolute 0.1 0.0 - 0.5 K/uL   Basophils Relative 1 %   Basophils Absolute 0.1 0.0 - 0.1 K/uL   Immature Granulocytes 0 %   Abs Immature Granulocytes 0.02 0.00 - 0.07 K/uL    Comment: Performed at [facility], [address] [place], [place] [address]  Comprehensive metabolic panel     Status: None   Collection Time: 04/06/21  1:47 PM  Result Value Ref Range   Sodium 141 135 - 145 mmol/L   Potassium 3.5 3.5 - 5.1 mmol/L   Chloride 103 98 - 111 mmol/L   CO2 24 22 - 32 mmol/L   Glucose, Bld 86 70 - 99 mg/dL    Comment: Glucose reference range applies only to samples taken after fasting for at least 8 hours.   BUN 8 6 - 20 mg/dL   Creatinine, Ser 0.59 0.44 - 1.00 mg/dL   Calcium 9.8 8.9 - 10.3 mg/dL   Total Protein 7.6 6.5 - 8.1 g/dL  Albumin 4.6 3.5 - 5.0 g/dL   AST 19 15 - 41 U/L   ALT 17 0 - 44 U/L   Alkaline Phosphatase 73 38 - 126 U/L   Total Bilirubin 1.2 0.3 - 1.2 mg/dL   GFR, Estimated >60 >60 mL/min    Comment: (NOTE) Calculated using the CKD-EPI Creatinine Equation (2021)    Anion gap 14 5 - 15    Comment: Performed at [facility] [address] [place], [place] [address]  Hemoglobin A1c     Status: None   Collection Time: 04/06/21  1:47 PM  Result Value Ref Range   Hgb A1c MFr Bld 5.0 4.8 - 5.6 %    Comment: (NOTE) Pre diabetes:          5.7%-6.4%  Diabetes:              >6.4%  Glycemic control for   <7.0% adults with diabetes    Mean Plasma Glucose 96.8 mg/dL    Comment: Performed at [facility] [address], [place], [place] [address]  Magnesium     Status: None   Collection Time: 04/06/21  1:47 PM  Result Value Ref Range   Magnesium 2.2 1.7 - 2.4 mg/dL    Comment: Performed at [facility] [address], [place], [place] [address]  Ethanol      Status: None   Collection Time: 04/06/21  1:47 PM  Result Value Ref Range   Alcohol, Ethyl (B) <10 <10 mg/dL    Comment: (NOTE) Lowest detectable limit for serum alcohol is 10 mg/dL.  For medical purposes only. Performed at [facility], [place] [address] [place], [place] [address]   Lipid panel     Status: None   Collection Time: 04/06/21  1:47 PM  Result Value Ref Range   Cholesterol 155 0 - 200 mg/dL   Triglycerides 48 <150 mg/dL   HDL 61 >40 mg/dL   Total CHOL/HDL Ratio 2.5 RATIO   VLDL 10 0 - 40 mg/dL   LDL Cholesterol 84 0 - 99 mg/dL    Comment:        Total Cholesterol/HDL:CHD Risk Coronary Heart Disease Risk Table                     Men   Women  1/2 Average Risk   3.4   3.3  Average Risk       5.0   4.4  2 X Average Risk   9.6   7.1  3 X Average Risk  23.4   11.0        Use the calculated Patient Ratio above and the CHD Risk Table to determine the patient's CHD Risk.        ATP III CLASSIFICATION (LDL):  <100     mg/dL   Optimal  100-129  mg/dL   Near or Above                    Optimal  130-159  mg/dL   Borderline  160-189  mg/dL   High  >190     mg/dL   Very High Performed at [facility] [address], [place], [place] [address]   TSH     Status: Abnormal   Collection Time: 04/06/21  1:47 PM  Result Value Ref Range   TSH <0.010 (L) 0.350 - 4.500 uIU/mL    Comment: Performed by a 3rd Generation  assay with a functional sensitivity of <=0.01 uIU/mL. Performed at [facility], [place] [address] [place], [place] [address]   Pregnancy, urine     Status: None   Collection Time: 04/06/21  1:48 PM  Result Value Ref Range   Preg Test, Ur NEGATIVE NEGATIVE    Comment:        THE SENSITIVITY OF THIS METHODOLOGY IS >20 mIU/mL. Performed at [facility], [place] [address] [place], [place] [address]   Urinalysis, Routine w reflex microscopic Urine, Clean Catch     Status: Abnormal   Collection Time: 04/06/21  1:48 PM  Result Value Ref Range    Color, Urine AMBER (A) YELLOW    Comment: BIOCHEMICALS MAY BE AFFECTED BY COLOR   APPearance HAZY (A) CLEAR   Specific Gravity, Urine 1.031 (H) 1.005 - 1.030   pH 5.0 5.0 - 8.0   Glucose, UA NEGATIVE NEGATIVE mg/dL   Hgb urine dipstick NEGATIVE NEGATIVE   Bilirubin Urine NEGATIVE NEGATIVE   Ketones, ur 20 (A) NEGATIVE mg/dL   Protein, ur 30 (A) NEGATIVE mg/dL   Nitrite NEGATIVE NEGATIVE   Leukocytes,Ua NEGATIVE NEGATIVE   RBC / HPF 0-5 0 - 5 RBC/hpf   WBC, UA 0-5 0 - 5 WBC/hpf   Bacteria, UA RARE (A) NONE SEEN   Squamous Epithelial / LPF 0-5 0 - 5   Mucus PRESENT    Hyaline Casts, UA PRESENT     Comment: Performed at [facility], [address] [place], [place] [address]  POCT Urine Drug Screen - (ICup)     Status: Abnormal   Collection Time: 04/06/21  1:53 PM  Result Value Ref Range   POC Amphetamine UR None Detected NONE DETECTED (Cut Off Level 1000 ng/mL)   POC Secobarbital (BAR) None Detected NONE DETECTED (Cut Off Level 300 ng/mL)   POC Buprenorphine (BUP) None Detected NONE DETECTED (Cut Off Level 10 ng/mL)   POC Oxazepam (BZO) None Detected NONE DETECTED (Cut Off Level 300 ng/mL)   POC Cocaine UR None Detected NONE DETECTED (Cut Off Level 300 ng/mL)   POC Methamphetamine UR None Detected NONE DETECTED (Cut Off Level 1000 ng/mL)   POC Morphine None Detected NONE DETECTED (Cut Off Level 300 ng/mL)   POC Oxycodone UR None Detected NONE DETECTED (Cut Off Level 100 ng/mL)   POC Methadone UR None Detected NONE DETECTED (Cut Off Level 300 ng/mL)   POC Marijuana UR Positive (A) NONE DETECTED (Cut Off Level 50 ng/mL)     Past Medical History:  Past Medical History:  Diagnosis Date   Deliberate self-cutting    Panic attacks    Scoliosis    Seasonal allergies    History reviewed. No pertinent surgical history. Family History:  Family History  Problem Relation Age of Onset   Diabetes Father     Social History:  Social History   Substance and Sexual Activity   Alcohol Use No     Social History   Substance and Sexual Activity  Drug Use No    Social History   Socioeconomic History   Marital status: Single    Spouse name: Not on file   Number of children: Not on file   Years of education: Not on file   Highest education level: Not on file  Occupational History   Not on file  Tobacco Use   Smoking status: Every Day   Smokeless tobacco: Never  Substance and Sexual Activity   Alcohol use: No  Drug use: No   Sexual activity: Not on file  Other Topics Concern   Not on file  Social History Narrative   Not on file   Social Determinants of Health   Financial Resource Strain: Not on file  Food Insecurity: Not on file  Transportation Needs: Not on file  Physical Activity: Not on file  Stress: Not on file  Social Connections: Not on file   SDOH:  SDOH Screenings   Alcohol Screen: Not on file  Depression (PHQ2-9): Medium Risk   PHQ-2 Score: 22  Financial Resource Strain: Not on file  Food Insecurity: Not on file  Housing: Not on file  Physical Activity: Not on file  Social Connections: Not on file  Stress: Not on file  Tobacco Use: High Risk   Smoking Tobacco Use: Every Day   Smokeless Tobacco Use: Never   Passive Exposure: Not on file  Transportation Needs: Not on file    Tobacco Cessation:  Prescription not provided because: patient transferred to inpatient psychiatric facility.  Current Medications:  Current Facility-Administered Medications  Medication Dose Route Frequency Provider Last Rate Last Admin   acetaminophen (TYLENOL) tablet 650 mg  650 mg Oral Q6H PRN Rankin, Shuvon B, NP       alum & mag hydroxide-simeth (MAALOX/MYLANTA) 200-200-20 MG/5ML suspension 30 mL  30 mL Oral Q4H PRN Rankin, Shuvon B, NP       hydrOXYzine (ATARAX) tablet 25 mg  25 mg Oral TID PRN Rankin, Shuvon B, NP       magnesium hydroxide (MILK OF MAGNESIA) suspension 30 mL  30 mL Oral Daily PRN Rankin, Shuvon B, NP       sertraline (ZOLOFT)  tablet 25 mg  25 mg Oral Daily Rankin, Shuvon B, NP   25 mg at 04/06/21 1635   traZODone (DESYREL) tablet 50 mg  50 mg Oral QHS PRN Rankin, Shuvon B, NP       No current outpatient medications on file.    PTA Medications:  Prior to Admission medications   Not on File      Musculoskeletal  Strength & Muscle Tone: within normal limits Gait & Station: normal Patient leans: N/A  Psychiatric Specialty Exam  Presentation  General Appearance: Appropriate for Environment  Eye Contact:Fair  Speech:Clear and Coherent; Normal Rate  Speech Volume:Normal  Handedness:Right   Mood and Affect  Mood:Anxious; Depressed  Affect:Congruent; Depressed   Thought Process  Thought Processes:Coherent; Goal Directed  Descriptions of Associations:Intact  Orientation:Full (Time, Place and Person)  Thought Content:WDL  Diagnosis of Schizophrenia or Schizoaffective disorder in past: No    Hallucinations:Hallucinations: None  Ideas of Reference:None  Suicidal Thoughts:Suicidal Thoughts: Yes, Active SI Active Intent and/or Plan: With Intent; With Plan; With Means to [place]  Homicidal Thoughts:Homicidal Thoughts: No   Sensorium  Memory:Immediate Good; Recent Good; Remote Good  Judgment:Fair  Insight:Fair   Executive Functions  Concentration:Good  Attention Span:Good  [place] of Knowledge:Good  Language:Good   Psychomotor Activity  Psychomotor Activity:Psychomotor Activity: Normal   Assets  Assets:Communication Skills; Desire for Improvement; Housing; Catering manager; Physical Health   Sleep  Sleep:Sleep: Fair   Nutritional Assessment (For OBS and FBC admissions only) Has the patient had a weight loss or gain of 10 pounds or more in the last 3 months?: No Has the patient had a decrease in food intake/or appetite?: No Does the patient have dental problems?: No Does the patient have eating habits or behaviors that may be indicators of  an  eating disorder including binging or inducing vomiting?: No Has the patient recently lost weight without trying?: 0 Has the patient been eating poorly because of a decreased appetite?: 0 Malnutrition Screening Tool Score: 0    Physical Exam  Physical Exam HENT:     Right Ear: External ear normal.     Left Ear: External ear normal.  Cardiovascular:     Rate and Rhythm: Normal rate.  Pulmonary:     Effort: Pulmonary effort is normal. No respiratory distress.  Musculoskeletal:        General: Normal range of motion.  Neurological:     Mental Status: She is alert and oriented to person, place, and time.   Review of Systems  Respiratory:  Negative for cough and shortness of breath.   Cardiovascular:  Negative for chest pain.  Gastrointestinal:  Negative for diarrhea, nausea and vomiting.  Blood pressure 122/65, pulse 68, temperature 98.1 F (36.7 C), temperature source Oral, resp. rate 16, SpO2 98 %. There is no height or weight on file to calculate BMI.    Disposition: Transfer to [facility] for inpatient psychiatric treatment  Rozetta Nunnery, NP 04/06/2021, 10:58 PM

## 2021-04-06 NOTE — ED Notes (Signed)
Pt is awake and alert.  She has flat affect and sad mood.  Pt's skin assessment completed and she was oriented to obs unit.  Currently laying down  Quietly.  Staff will continue to monitor for safety

## 2021-04-06 NOTE — Progress Notes (Signed)
Superficial lacerations on left arm. No drainage noted. Old scars on bilateral thighs (lateral).

## 2021-04-06 NOTE — Progress Notes (Addendum)
At the end of CCA, pt was determined to be high risk for suicide. She endorsed SI with means and when asked about intent, stated "Yes but no." Cln informed pt that she needs to be evaluated for potential inpt due to being high risk for suicide. Pt stated was hesitant, stating "I don't know what to say. I'm worried they aren't going to understand what I'm trying to say." Advised pt that cln would contact [facility] staff to inform them of reported symptoms and be available to answer questions, to which pt was agreeable. Because pt lives alone and denies current supports and has to travel by city bus, cln asked pt to remain on the phone while traveling to [facility]. Pt stated her phone needs to be charged and cln consulted with [facility] outpt leadership Everlene Balls for safety planning. Pt agreed to answer her phone in 15 minute increments until she arrives at [facility] and agreed to leave immediately. Cln contacted [facility] staff at 11:16 am to confirm which providers to contact. Cln contacted Falencio Janee Morn, Hillery Jacks, Shuvon Rankin, and Vernard Gambles via secure chat to inform them of concerns and to ensure pt arrives at [facility].  CCA began at 10 am and safety concerns were noted at 11:00 am while pt was still present via webex. Initial safety planning initiated. 11:05 am- pt still present via Webex and [employer] consulted. Pt agrees to plan. 11:27 am- cln called pt and pt confirmed she is at the bus station. 11:43 am- pt stated she is almost at [facility]. Cln informed her she can start PHP tomorrow if she does not meet inpt criteria and cln will send paperwork to [facility]. Pt agreeable. 11:54 am- Falencio Thompson inform cln via secure chat that pt arrived to [facility]. Cln will f/u when disposition is determined by [facility].

## 2021-04-06 NOTE — ED Provider Notes (Signed)
Behavioral Health Admission [facility])  Date: 04/06/21 Patient Name: Alisha Washington MRN: [id number] Chief Complaint:  Chief Complaint  Patient presents with   Depression   Suicidal      Diagnoses:  Final diagnoses:  Severe recurrent major depression without psychotic features ([place])  Suicidal ideation    HPI: Alisha Washington, 26 y.o., female patient presents to [facility] sent by her counselor after complaints of suicidal ideation with plan and unable to contract for safety  Patient seen face to face by this provider, consulted with Dr. Ernie Hew; and chart reviewed on 04/06/21.  On evaluation [name] reports she was started on her partial hospitalization program and told counselor that she was having suicidal thoughts.  Patient reports she was then informed by her counselor that she needed to come here [facility]).  Patient states she is having suicidal thoughts with a plan to either cut herself or burned charcoal and breathing it in "because I heard that would kill you to."  Patient also reporting self harming behavior (cutting).  Reports she last cut this morning.  Patient has multiple superficial lacerations anterior and posterior lower forearm.  Reports cuts were made with a box cutter.  Patient denies prior suicide attempt but states she has had 1 psychiatric hospitalization when she was 21 years ago related to getting upset after argument with her boyfriend and hurting herself.  Patient reports she lives alone and she is employed at [employer].  During evaluation [name] is alert/oriented x 4; calm/cooperative; and mood is congruent with affect.  She does not appear to be responding to internal/external stimuli or delusional thoughts.  Patient denies psychosis, and paranoia; but continues to endorse suicidal ideation with plan and intent.  Patient answered question appropriately.   Recommended for inpatient psychiatric treatment.  Patient reports she is interested in starting medication.   Reports she has been on Effexor, Lexapro, and Prozac in the past but did not take them long enough to see if any of them would actually work.  PHQ 2-9:  Flowsheet Row Counselor from 04/06/2021 in [place]  Thoughts that you would be better off dead, or of hurting yourself in some way Nearly every day  PHQ-9 Total Score 22       Flowsheet Row Counselor from 04/06/2021 in [place] ED from 03/06/2021 in [place] ED from 10/06/2020 in [place] High Risk No Risk No Risk        Total Time spent with patient: 30 minutes  Musculoskeletal  Strength & Muscle Tone: within normal limits Gait & Station: normal Patient leans: N/A  Psychiatric Specialty Exam  Presentation General Appearance: Appropriate for Environment  Eye Contact:Fair  Speech:Clear and Coherent; Normal Rate  Speech Volume:Normal  Handedness:Right   Mood and Affect  Mood:Anxious; Depressed  Affect:Congruent; Depressed   Thought Process  Thought Processes:Coherent; Goal Directed  Descriptions of Associations:Intact  Orientation:Full (Time, Place and Person)  Thought Content:WDL  Diagnosis of Schizophrenia or Schizoaffective disorder in past: No   Hallucinations:Hallucinations: None  Ideas of Reference:None  Suicidal Thoughts:Suicidal Thoughts: Yes, Active SI Active Intent and/or Plan: With Intent; With Plan; With Means to [place]  Homicidal Thoughts:Homicidal Thoughts: No   Sensorium  Memory:Immediate Good; Recent Good; Remote Good  Judgment:Fair  Insight:Fair   Executive Functions  Concentration:Good  Attention Span:Good  Recall:Good  Fund of Knowledge:Good  Language:Good   Psychomotor  Activity  Psychomotor Activity:Psychomotor Activity: Normal   Assets  Assets:Communication Skills; Desire for  Improvement; Housing; Catering manager; Physical Health   Sleep  Sleep:Sleep: Fair   Nutritional Assessment (For OBS and FBC admissions only) Has the patient had a weight loss or gain of 10 pounds or more in the last 3 months?: No Has the patient had a decrease in food intake/or appetite?: No Does the patient have dental problems?: No Does the patient have eating habits or behaviors that may be indicators of an eating disorder including binging or inducing vomiting?: No Has the patient recently lost weight without trying?: 0 Has the patient been eating poorly because of a decreased appetite?: 0 Malnutrition Screening Tool Score: 0    Physical Exam ROS  Blood pressure (!) 145/96, pulse (!) 123, temperature 98.4 F (36.9 C), temperature source Oral, resp. rate 20, SpO2 99 %. There is no height or weight on file to calculate BMI.  Past Psychiatric History: MDD, Anxiety   Is the patient at risk to self? Yes  Has the patient been a risk to self in the past 6 months? No .    Has the patient been a risk to self within the distant past? No   Is the patient a risk to others? No   Has the patient been a risk to others in the past 6 months? No   Has the patient been a risk to others within the distant past? No   Past Medical History:  Past Medical History:  Diagnosis Date   Deliberate self-cutting    Panic attacks    Scoliosis    Seasonal allergies    History reviewed. No pertinent surgical history.  Family History:  Family History  Problem Relation Age of Onset   Diabetes Father     Social History:  Social History   Socioeconomic History   Marital status: Single    Spouse name: Not on file   Number of children: Not on file   Years of education: Not on file   Highest education level: Not on file  Occupational History   Not on file  Tobacco Use   Smoking status: Every Day   Smokeless tobacco: Never  Substance and Sexual Activity   Alcohol use: No    Drug use: No   Sexual activity: Not on file  Other Topics Concern   Not on file  Social History Narrative   Not on file   Social Determinants of Health   Financial Resource Strain: Not on file  Food Insecurity: Not on file  Transportation Needs: Not on file  Physical Activity: Not on file  Stress: Not on file  Social Connections: Not on file  Intimate Partner Violence: Not on file    SDOH:  SDOH Screenings   Alcohol Screen: Not on file  Depression (PHQ2-9): Medium Risk   PHQ-2 Score: 22  Financial Resource Strain: Not on file  Food Insecurity: Not on file  Housing: Not on file  Physical Activity: Not on file  Social Connections: Not on file  Stress: Not on file  Tobacco Use: High Risk   Smoking Tobacco Use: Every Day   Smokeless Tobacco Use: Never   Passive Exposure: Not on file  Transportation Needs: Not on file    Last Labs:  Admission on 10/06/2020, Discharged on 10/06/2020  Component Date Value Ref Range Status   WBC 10/06/2020 10.4  4.0 - 10.5 K/uL Final   RBC 10/06/2020 4.70  3.87 - 5.11 MIL/uL Final  Hemoglobin 10/06/2020 13.8  12.0 - 15.0 g/dL Final   HCT 10/06/2020 41.6  36.0 - 46.0 % Final   MCV 10/06/2020 88.5  80.0 - 100.0 fL Final   MCH 10/06/2020 29.4  26.0 - 34.0 pg Final   MCHC 10/06/2020 33.2  30.0 - 36.0 g/dL Final   RDW 10/06/2020 12.6  11.5 - 15.5 % Final   Platelets 10/06/2020 311  150 - 400 K/uL Final   nRBC 10/06/2020 0.0  0.0 - 0.2 % Final   Neutrophils Relative % 10/06/2020 47  % Final   Neutro Abs 10/06/2020 4.9  1.7 - 7.7 K/uL Final   Lymphocytes Relative 10/06/2020 42  % Final   Lymphs Abs 10/06/2020 4.4 (H)  0.7 - 4.0 K/uL Final   Monocytes Relative 10/06/2020 8  % Final   Monocytes Absolute 10/06/2020 0.9  0.1 - 1.0 K/uL Final   Eosinophils Relative 10/06/2020 2  % Final   Eosinophils Absolute 10/06/2020 0.2  0.0 - 0.5 K/uL Final   Basophils Relative 10/06/2020 1  % Final   Basophils Absolute 10/06/2020 0.1  0.0 - 0.1 K/uL  Final   Immature Granulocytes 10/06/2020 0  % Final   Abs Immature Granulocytes 10/06/2020 0.03  0.00 - 0.07 K/uL Final   Performed at [facility], [place] [address], [place], [place] [address]   Sodium 10/06/2020 137  135 - 145 mmol/L Final   Potassium 10/06/2020 3.9  3.5 - 5.1 mmol/L Final   Chloride 10/06/2020 105  98 - 111 mmol/L Final   CO2 10/06/2020 22  22 - 32 mmol/L Final   Glucose, Bld 10/06/2020 113 (H)  70 - 99 mg/dL Final   Glucose reference range applies only to samples taken after fasting for at least 8 hours.   BUN 10/06/2020 10  6 - 20 mg/dL Final   Creatinine, Ser 10/06/2020 0.41 (L)  0.44 - 1.00 mg/dL Final   Calcium 10/06/2020 9.5  8.9 - 10.3 mg/dL Final   GFR, Estimated 10/06/2020 >60  >60 mL/min Final   Comment: (NOTE) Calculated using the CKD-EPI Creatinine Equation (2021)    Anion gap 10/06/2020 10  5 - 15 Final   Performed at [facility] [address], [place], [place] [address]   Troponin I (High Sensitivity) 10/06/2020 3  <18 ng/L Final   Comment: (NOTE) Elevated high sensitivity troponin I (hsTnI) values and significant  changes across serial measurements may suggest ACS but many other  chronic and acute conditions are known to elevate hsTnI results.  Refer to the "Links" section for chest pain algorithms and additional  guidance. Performed at [facility], [place] [address] [place], [place] [address]    I-stat hCG, quantitative 10/06/2020 <5.0  <5 mIU/mL Final   Comment 3 10/06/2020          Final   Comment:   GEST. AGE      CONC.  (mIU/mL)   <=1 WEEK        5 - 50     2 WEEKS       50 - 500     3 WEEKS       100 - 10,000     4 WEEKS     1,000 - 30,000        FEMALE AND NON-PREGNANT FEMALE:     LESS THAN 5 mIU/mL     Allergies: Patient has no known allergies.  PTA Medications: (Not in a hospital admission)  Medical Decision Making  Patient admitted to continuous assessment unit while awaiting appropriate bed for inpatient  psychiatric treatment. Lab Orders         Resp Panel by RT-PCR (Flu A&B, Covid) Nasopharyngeal Swab         CBC with Differential/Platelet         Comprehensive metabolic panel         Hemoglobin A1c         Magnesium         Ethanol         Lipid panel         TSH         Pregnancy, urine         Urinalysis, Routine w reflex microscopic Urine, Clean Catch         POC SARS Coronavirus 2 Ag-ED - Nasal Swab         POCT Urine Drug Screen - ([facility])      Meds ordered this encounter  Medications   acetaminophen (TYLENOL) tablet 650 mg   alum & mag hydroxide-simeth (MAALOX/MYLANTA) 200-200-20 MG/5ML suspension 30 mL   magnesium hydroxide (MILK OF MAGNESIA) suspension 30 mL   traZODone (DESYREL) tablet 50 mg   hydrOXYzine (ATARAX) tablet 25 mg   sertraline (ZOLOFT) tablet 25 mg       Recommendations  Based on my evaluation the patient does not appear to have an emergency medical condition. Inpatient psychiatric treatment  Earleen Newport, NP 04/06/21  1:06 PM

## 2021-04-06 NOTE — Progress Notes (Addendum)
*  Update Per night AC Oluwatosin, RN pt can arrive at 2150.  Pt was accepted to [facility] today PENDING Negative COVID-19 and labs.  Pt meets inpatient criteria per Assunta Found, NP  Attending Physician will be Dr. Loleta Chance  Report can be called to: --Adult unit: [phone]  [facility] requested that voluntary consent for admission faxed to [phone]. The night shift AC is to coordinate the best time for arrival.   Care Team notified: Ryta Garlan Fair, RN, Darcey Nora, [facility], Ardith Dark, LCSWA, Assunta Found, NP, Bedelia Person, RN, and [facility] Rona Ravens, RN  [place], [place] 04/06/2021 @ 3:06 PM
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
## 2021-04-06 NOTE — ED Notes (Signed)
Pt currently resting quietly.  Breathing even and unlabored.  No distress noted.  Continue to monitor for safety.  °

## 2021-04-06 NOTE — ED Notes (Signed)
Pt is currently sleeping, no distress noted, environmental check complete, will continue to monitor patient for safety. ? ?

## 2021-04-07 ENCOUNTER — Other Ambulatory Visit: Payer: Self-pay

## 2021-04-07 LAB — TSH: TSH: <0.01 u[IU]/mL — ABNORMAL LOW (ref 0.350–4.500)

## 2021-04-07 LAB — GC/CHLAMYDIA PROBE AMP (~~LOC~~) NOT AT ARMC
Chlamydia: NEGATIVE
Comment: NEGATIVE
Comment: NORMAL
Neisseria Gonorrhea: NEGATIVE

## 2021-04-07 LAB — T4, FREE: Free T4: 1.25 ng/dL — ABNORMAL HIGH (ref 0.61–1.12)

## 2021-04-07 MED ORDER — SERTRALINE HCL 50 MG PO TABS
50.0000 mg | ORAL_TABLET | Freq: Every day | ORAL | Status: DC
  Administered 2021-04-08 – 2021-04-09 (×2): 50 mg via ORAL
  Filled 2021-04-07 (×3): qty 1

## 2021-04-07 MED ORDER — NICOTINE 14 MG/24HR TD PT24: 14.0000 mg | MEDICATED_PATCH | Freq: Every day | TRANSDERMAL | Status: DC | PRN

## 2021-04-07 NOTE — [facility] Group Notes (Signed)
Pt attended group and was guarded

## 2021-04-07 NOTE — [facility] Suicide Risk Assessment (Signed)
[facility] INPATIENT:  Family/Significant Other Suicide Prevention Education  Suicide Prevention Education:  Patient Refusal for Family/Significant Other Suicide Prevention Education: The patient Alisha Washington has refused to provide written consent for family/significant other to be provided Family/Significant Other Suicide Prevention Education during admission and/or prior to discharge.  Physician notified.  Metro Kung Dorean Daniello 04/07/2021, 10:06 AM

## 2021-04-07 NOTE — Group Note (Signed)
Recreation Therapy Group Note   Group Topic:Animal Assisted Therapy   Group Date: 04/07/2021 Start Time: 1420 End Time: 1507 Facilitators: Caroll Rancher, LRT,CTRS Location: [address]   AAA/T Program Assumption of Risk Form signed by Patient/ or Parent Legal Guardian YES  Patient is free of allergies or severe asthma  YES  Patient reports no fear of animals YES  Patient reports no history of cruelty to animals YES  Patient understands their participation is voluntary YES  Patient washes hands before animal contact YES  Patient washes hands after animal contact YES   Group Description: Patients provided opportunity to interact with trained and credentialed [employer] Therapy dog and the community volunteer/dog handler. Patients practiced appropriate animal interaction and were educated on dog safety outside of the hospital in common community settings. Patients were allowed to use dog toys and other items to practice commands, engage the dog in play, and/or complete routine aspects of animal care.   Education: Charity fundraiser, Health visitor, Communication & Social Skills    Affect/Mood: Appropriate   Participation Level: Minimal    Clinical Observations/Individualized Feedback: Pt was observant of peers as they interacted with therapy dog.     Plan: Continue to engage patient in RT group sessions 2-3x/week.   Caroll Rancher, Antonietta Jewel 04/07/2021 3:42 PM

## 2021-04-07 NOTE — Tx Team (Signed)
Initial Treatment Plan 04/07/2021 12:09 AM Wayland Denis [id number]    PATIENT STRESSORS: Other: Patient states "none"     PATIENT STRENGTHS: General fund of knowledge  Physical Health  Work skills    PATIENT IDENTIFIED PROBLEMS: Depression  Suicidal thoughts  Self inflicted cutting    "Nothing"             DISCHARGE CRITERIA:  Improved stabilization in mood, thinking, and/or behavior Need for constant or close observation no longer present Reduction of life-threatening or endangering symptoms to within safe limits Verbal commitment to aftercare and medication compliance  PRELIMINARY DISCHARGE PLAN: Outpatient therapy Medication management  PATIENT/FAMILY INVOLVEMENT: This treatment plan has been presented to and reviewed with the patient, Rojean Ige.  The patient and family have been given the opportunity to ask questions and make suggestions.  Levin Bacon, RN 04/07/2021, 12:09 AM

## 2021-04-07 NOTE — Progress Notes (Signed)
Alisha Washington returned to unit early from cafeteria.  "I just didn't want anything."  She went directly to her room and sat on her bed.  During various safety checks, she was noted sitting on the bed with her hair covering her face.  Again offered breakfast and she declined.

## 2021-04-07 NOTE — Progress Notes (Addendum)
Alisha Washington is a 26 year old female being admitted voluntarily to [facility] from [facility].  She was participating in the Partial Hospitalization program and told the counselor that she was having suicidal thoughts.  Her plan was to cut herself or burn charcoal to breath it in.  Counselor recommended that she go to [facility] for an evaluation.  She was unable to provide recent stressors, was very vague and did not want to discuss.  She denied HI or AVH.  During [facility] admission, she was minimal and answered various questions with yes/no or "I don't know."  She appeared guarded and sad.  She was unable to provide symptoms to her depression, "I cant answer that because I am really foggy right now."  After that question, she didn't want to participate with the admission process.  When asked what her goals are for this hospitalization she stated "I don't know, none."  She denied any pain or discomfort and appeared to be in no physical distress.   Admission paperwork completed and signed.  Belongings searched and secured in locker # 13.  Skin assessment completed and superficial self inflicted cuts to anterior and posterior left lower arm.  Old self inflicted scars on bilateral upper thighs.  No contraband found.  Suicide safety plan reviewed, given to patient to complete and return to her nurse.  Q 15 minute checks initiated for safety.

## 2021-04-07 NOTE — [facility] Group Notes (Signed)
PT was informed but di not attend group

## 2021-04-07 NOTE — Progress Notes (Signed)
°   04/07/21 1700  Psych Admission Type (Psych Patients Only)  Admission Status Voluntary  Psychosocial Assessment  Patient Complaints None  Eye Contact Brief  Facial Expression Sad;Flat  Affect Depressed  Speech Soft  Interaction Avoidant;Cautious;Childlike  Motor Activity Slow  Appearance/Hygiene Unremarkable  Behavior Characteristics Appropriate to situation;Cooperative  Mood Depressed;Apprehensive  Aggressive Behavior  Targets Self  Type of Behavior Other (Comment) (cutting)  Effect Self-harm  Thought Process  Coherency WDL  Content WDL  Delusions WDL  Perception WDL  Hallucination None reported or observed  Judgment Impaired  Confusion Mild  Danger to Self  Current suicidal ideation? Passive  Self-Injurious Behavior Some self-injurious ideation observed or expressed.  No lethal plan expressed   Agreement Not to Harm Self Yes  Description of Agreement Verbal agreement to not harm self.  Danger to Others  Danger to Others None reported or observed
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
## 2021-04-08 ENCOUNTER — Telehealth: Payer: Self-pay | Admitting: Emergency Medicine

## 2021-04-08 ENCOUNTER — Encounter (HOSPITAL_COMMUNITY): Payer: Self-pay

## 2021-04-08 LAB — T3: T3, Total: 161 ng/dL (ref 71–180)

## 2021-04-08 MED ORDER — TRAZODONE HCL 50 MG PO TABS
50.0000 mg | ORAL_TABLET | Freq: Every day | ORAL | Status: DC
  Administered 2021-04-08 – 2021-04-09 (×2): 50 mg via ORAL
  Filled 2021-04-08 (×3): qty 1

## 2021-04-08 NOTE — Progress Notes (Signed)
?   04/07/21 2058  ?Psych Admission Type (Psych Patients Only)  ?Admission Status Voluntary  ?Psychosocial Assessment  ?Patient Complaints Anhedonia  ?Eye Contact Brief  ?Facial Expression Sad;Flat  ?Affect Depressed;Sad  ?Speech Soft  ?Interaction Avoidant;Cautious;Isolative;Minimal  ?Motor Activity Slow  ?Appearance/Hygiene Unremarkable  ?Behavior Characteristics Guarded  ?Mood Depressed;Sad  ?Thought Process  ?Coherency WDL  ?Content WDL  ?Delusions WDL  ?Perception WDL  ?Hallucination None reported or observed  ?Judgment Impaired  ?Confusion Mild  ?Danger to Self  ?Current suicidal ideation? Passive  ?Self-Injurious Behavior Some self-injurious ideation observed or expressed.  No lethal plan expressed   ?Agreement Not to Harm Self Yes  ?Description of Agreement Verbal agreement to not harm herself  ?Danger to Others  ?Danger to Others None reported or observed  ? ?Alisha Washington was isolate to her room.  She did not go to evening wrap up group.  She did not come out of her room for snacks.  She continued to report passive SI and verbally agrees not to hurt herself on the unit.  She denied HI or AVH.  She reported her day was 0/10 (10 the best) but wasn't able to explain the answer.  She reported her depression was 8/10 and anxiety 3/10 (10 the worst).  Most of her answer to questions were "I don't know."  Q 15 minute checks maintained for safety. ?

## 2021-04-08 NOTE — Progress Notes (Signed)
[facility] MD Progress Note  04/08/2021 4:02 PM Ravinder Furlough  MRN:  [id number] Subjective:  Patient is a 26 year old female with a past psychiatric history of major depressive disorder, who was admitted to the psychiatric unit from the [facility] for evaluation and treatment of suicidal thoughts with plan to cut herself or burned charcoal to breathe it in.  Chart review from last 24 hours-The patient's chart was reviewed and nursing notes were reviewed. Patient discussed in progression rounds with treatment team. MAR was reviewed and Pt is complaint  with scheduled medications and did not require any PRN medications.    Pt is seen and examined today.  Patient is guarded and does not share much information. She has fair eye contact.  Pt states her mood is same and depressed. She denies any anxiety.  Pt states she slept in the evening time yesterday but woke up around 2:30 AM last night and could not fall asleep.  Pt states her appetite is poor.  She did not talk to any of the family members on phone.  Currently, patient states she is still having suicidal thoughts sometimes.  When asked about plan, patient states "I cannot do anything here".  Pt denies homicidal ideation and, visual and auditory hallucination. She denies paranoia. Pt denies any headache, nausea, vomiting, dizziness, chest pain, SOB, abdominal pain, diarrhea, and constipation. Pt denies any medication side effects and has been tolerating it well.  Discussed that her thyroid hormones were abnormal with TSH low and high T4 indicating hyper active thyroid.  Discussed that we will consult IM to see if we need to start any medications.  She verbalizes understanding.  She is anxious to leave but denies any concerns.  Principal Problem: Severe recurrent major depression ([place]) Diagnosis: Principal Problem:   Severe recurrent major depression ([place])  Total Time spent with patient: 30 minutes I personally spent 30 minutes on the unit in direct patient care. The  direct patient care time included face-to-face time with the patient, reviewing the patient's chart, communicating with other professionals, and coordinating care. Greater than 50% of this time was spent in counseling or coordinating care with the patient regarding goals of hospitalization, psycho-education, and discharge planning needs.  Past Psychiatric History: see H&P  Past Medical History:  Past Medical History:  Diagnosis Date   Deliberate self-cutting    Panic attacks    Scoliosis    Seasonal allergies    History reviewed. No pertinent surgical history. Family History:  Family History  Problem Relation Age of Onset   Diabetes Father    Family Psychiatric  History: See H&P Social History:  Social History   Substance and Sexual Activity  Alcohol Use No     Social History   Substance and Sexual Activity  Drug Use Yes   Types: Marijuana    Social History   Socioeconomic History   Marital status: Single    Spouse name: Not on file   Number of children: Not on file   Years of education: Not on file   Highest education level: Not on file  Occupational History   Not on file  Tobacco Use   Smoking status: Never   Smokeless tobacco: Never  Vaping Use   Vaping Use: Every day   Substances: Nicotine  Substance and Sexual Activity   Alcohol use: No   Drug use: Yes    Types: Marijuana   Sexual activity: Yes    Birth control/protection: Condom  Other Topics Concern   Not on  file  Social History Narrative   Not on file   Social Determinants of Health   Financial Resource Strain: Not on file  Food Insecurity: Not on file  Transportation Needs: Not on file  Physical Activity: Not on file  Stress: Not on file  Social Connections: Not on file   Additional Social History:                         Sleep: Fair slept during the evening time but woke up early at night.  Appetite:  Poor  Current Medications: Current Facility-Administered Medications   Medication Dose Route Frequency Provider Last Rate Last Admin   acetaminophen (TYLENOL) tablet 650 mg  650 mg Oral Q6H PRN Rozetta Nunnery, NP       alum & mag hydroxide-simeth (MAALOX/MYLANTA) 200-200-20 MG/5ML suspension 30 mL  30 mL Oral Q4H PRN Rozetta Nunnery, NP       hydrOXYzine (ATARAX) tablet 25 mg  25 mg Oral TID PRN Rozetta Nunnery, NP       magnesium hydroxide (MILK OF MAGNESIA) suspension 30 mL  30 mL Oral Daily PRN Lindon Romp A, NP       nicotine (NICODERM CQ - dosed in mg/24 hours) patch 14 mg  14 mg Transdermal Daily PRN Massengill, Ovid Curd, MD       sertraline (ZOLOFT) tablet 50 mg  50 mg Oral Daily Massengill, Ovid Curd, MD   50 mg at 04/08/21 [id number]   traZODone (DESYREL) tablet 50 mg  50 mg Oral QHS Armando Reichert, MD        Lab Results:  Results for orders placed or performed during the hospital encounter of 04/06/21 (from the past 48 hour(s))  T4, free     Status: Abnormal   Collection Time: 04/07/21  6:12 AM  Result Value Ref Range   Free T4 1.25 (H) 0.61 - 1.12 ng/dL    Comment: (NOTE) Biotin ingestion may interfere with free T4 tests. If the results are inconsistent with the TSH level, previous test results, or the clinical presentation, then consider biotin interference. If needed, order repeat testing after stopping biotin. Performed at [facility], [place] [address], [place], [place] [address]   TSH     Status: Abnormal   Collection Time: 04/07/21  6:12 AM  Result Value Ref Range   TSH <0.010 (L) 0.350 - 4.500 uIU/mL    Comment: Performed by a 3rd Generation assay with a functional sensitivity of <=0.01 uIU/mL. Performed at [facility], [place] [address] [place],  [address]   T3     Status: None   Collection Time: 04/07/21  6:12 AM  Result Value Ref Range   T3, Total 161 71 - 180 ng/dL    Comment: (NOTE) Performed At: [facility] [place], [place] [id number] Rush Farmer MD [id number]     Blood  Alcohol level:  Lab Results  Component Value Date   [facility] <10 04/06/2021   ETH <10 [id number]    Metabolic Disorder Labs: Lab Results  Component Value Date   HGBA1C 5.0 04/06/2021   MPG 96.8 04/06/2021   MPG 105.41 11/21/2016   No results found for: PROLACTIN Lab Results  Component Value Date   CHOL 155 04/06/2021   TRIG 48 04/06/2021   HDL 61 04/06/2021   CHOLHDL 2.5 04/06/2021   VLDL 10 04/06/2021   LDLCALC 84 04/06/2021   LDLCALC 55 11/21/2016    Physical  Findings: AIMS: Facial and Oral Movements Muscles of Facial Expression: None, normal Lips and Perioral Area: None, normal Jaw: None, normal Tongue: None, normal,Extremity Movements Upper (arms, wrists, hands, fingers): None, normal Lower (legs, knees, ankles, toes): None, normal, Trunk Movements Neck, shoulders, hips: None, normal, Overall Severity Severity of abnormal movements (highest score from questions above): None, normal Incapacitation due to abnormal movements: None, normal Patient's awareness of abnormal movements (rate only patient's report): No Awareness, Dental Status Current problems with teeth and/or dentures?: No Does patient usually wear dentures?: No  CIWA:    COWS:     Musculoskeletal: Strength & Muscle Tone: within normal limits Gait & Station: normal Patient leans: N/A  Psychiatric Specialty Exam:  Presentation  General Appearance: Appropriate for Environment  Eye Contact:Fair  Speech:Normal Rate  Speech Volume:Decreased  Handedness:Right   Mood and Affect  Mood:Anxious; Depressed  Affect:Congruent; Restricted   Thought Process  Thought Processes:Coherent; Linear; Goal Directed Guarded , doesn't share much information.  Descriptions of Associations:Intact  Orientation:Full (Time, Place and Person)  Thought Content:Logical; WDL  History of Schizophrenia/Schizoaffective disorder:No  Duration of Psychotic Symptoms:N/A  Hallucinations:Hallucinations: None  Ideas of  Reference:None  Suicidal Thoughts:Suicidal Thoughts: Yes, Active (No plan) SI Active Intent and/or Plan: Without Plan; Without Means to Carry Out  Homicidal Thoughts:Homicidal Thoughts: No   Sensorium  Memory:Immediate Good; Recent Good; Remote Good  Judgment:Poor  Insight:Poor   Executive Functions  Concentration:Poor  Attention Span:Fair  Recall:Good  Fund of Knowledge:Fair  Language:Fair   Psychomotor Activity  Psychomotor Activity:Psychomotor Activity: Decreased   Assets  Assets:Communication Skills; Desire for Improvement; Housing; Catering manager; Physical Health   Sleep  Sleep:Sleep: Fair    Physical Exam: Physical Exam Vitals and nursing note reviewed.  Constitutional:      General: She is not in acute distress.    Appearance: Normal appearance. She is not ill-appearing, toxic-appearing or diaphoretic.  Pulmonary:     Effort: Pulmonary effort is normal.  Neurological:     General: No focal deficit present.     Mental Status: She is alert and oriented to person, place, and time.   Review of Systems  Constitutional:  Negative for chills and fever.  Respiratory:  Negative for cough and shortness of breath.   Cardiovascular:  Negative for chest pain.  Gastrointestinal:  Negative for abdominal pain, diarrhea, heartburn, nausea and vomiting.  Neurological:  Negative for dizziness and headaches.  Psychiatric/Behavioral:  Positive for depression and suicidal ideas. Negative for hallucinations. The patient is nervous/anxious and has insomnia.   Blood pressure 132/84, pulse 92, temperature 98.4 F (36.9 C), temperature source Oral, resp. rate 14, height 5\' 5"  (1.651 m), weight 90.3 kg, last menstrual period 04/06/2021, SpO2 99 %. Body mass index is 33.12 kg/m.   Treatment Plan Summary:Patient is a 26 year old female with a past psychiatric history of major depressive disorder, who was admitted to the psychiatric unit from the [facility] for  evaluation and treatment of suicidal thoughts with plan to cut herself or burned charcoal to breathe it in. 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 Psychiatric Diagnoses and Treatment:               -Continue Zoloft 50 mg/day, for MDD, GAD, PTSD.  -Change trazodone to 50 mg nightly (scheduled instead of as needed)  3. Medical Issues Being Addressed:              Tobacco Use Disorder             -- Nicotine patch 14mg /24 hours ordered             -- Smoking cessation encouraged  4. Abnormal thyroid labs: Low TSH, elevated T4 -T3 pending -Consulted IM and spoke to Dr Reesa Chew- order TSI, TraB, thyroid antibodies. Recommended  that If abnormal, recommend endocrinology consult and outpatient follow-up.  Otherwise repeat lab work outpatient in 3-4 weeks. -vitals wnl at present time    4. Discharge Planning:              -- Social work and case management to assist with discharge planning and identification of hospital follow-up needs prior to discharge          Armando Reichert, MD PGY2 04/08/2021, 4:02 PM

## 2021-04-08 NOTE — Progress Notes (Signed)
?   04/08/21 0900  ?Psych Admission Type (Psych Patients Only)  ?Admission Status Voluntary  ?Psychosocial Assessment  ?Patient Complaints Depression  ?Eye Contact Brief  ?Facial Expression Sad;Flat  ?Affect Depressed;Sad  ?Speech Soft  ?Interaction Isolative  ?Motor Activity Slow  ?Appearance/Hygiene Unremarkable  ?Behavior Characteristics Guarded  ?Mood Depressed  ?Thought Process  ?Coherency WDL  ?Content WDL  ?Delusions None reported or observed  ?Perception WDL  ?Hallucination None reported or observed  ?Judgment Impaired  ?Confusion Mild  ?Danger to Self  ?Current suicidal ideation? Passive  ?Self-Injurious Behavior Some self-injurious ideation observed or expressed.  No lethal plan expressed   ?Agreement Not to Harm Self Yes  ?Description of Agreement verbal  ?Danger to Others  ?Danger to Others None reported or observed  ? ? ?

## 2021-04-08 NOTE — Group Note (Signed)
LCSW Group Therapy Note ? ?Group Date: 04/08/2021 ?Start Time: 1300 ?End Time: 1400 ? ? ?Type of Therapy and Topic:  Group Therapy: Using "I" Statements ? ?Participation Level:  Active ? ?Description of Group:  ?Patients were asked to provide details of some interpersonal conflicts they have experienced. Patients were then educated about ?I? statements, communication which focuses on feelings or views of the speaker rather than what the other person is doing. T group members were asked to reflect on past conflicts and to provide specific examples for utilizing ?I? statements. ? ?Therapeutic Goals: ? ?Patients will verbalize understanding of ineffective communication and effective communication. ?Patients will be able to empathize with whom they are having conflict. ?Patients will practice effective communication in the form of ?I? statements. ? ? ? ?Summary of Patient Progress:  Alisha Washington shared ways to have conversations to use I statements. The patient was present/active throughout the session and proved open to feedback from CSW and peers. Patient demonstrated good insight into the subject matter, was respectful of peers, and was present throughout the entire session. ? ?Therapeutic Modalities:   ?Cognitive Behavioral Therapy ?Solution-Focused Therapy ? ? ? ?Besan Ketchem E Gustin Zobrist, LCSW ?04/08/2021  2:17 PM   ? ?

## 2021-04-08 NOTE — Group Note (Signed)
Recreation Therapy Group Note ? ? ?Group Topic:Stress Management  ?Group Date: 04/08/2021 ?Start Time: 0930 ?End Time: 0950 ?Facilitators: Victorino Sparrow, LRT,CTRS ?Location: [place] ? ? ?Goal Area(s) Addresses:  ?Patient will actively participate in stress management techniques presented during session.  ?Patient will successfully identify benefit of practicing stress management post d/c.  ? ?Group Description: Guided Imagery. LRT provided education, instruction, and demonstration on practice of visualization via guided imagery. Patient was asked to participate in the technique introduced during session. LRT debriefed including topics of mindfulness, stress management and specific scenarios each patient could use these techniques. Patients were given suggestions of ways to access scripts post d/c and encouraged to explore Youtube and other apps available on smartphones, tablets, and computers. ? ? ? ?Affect/Mood: Appropriate ?  ?Participation Level: Active ?  ?Participation Quality: Independent ?  ?Behavior: Attentive  ?  ?Speech/Thought Process: Focused ?  ?Insight: Good ?  ?Judgement: Good ?  ?Modes of Intervention: Script, Petra Kuba Sounds ?  ?Patient Response to Interventions:  Attentive ?  ?Education Outcome: ? Acknowledges education and In group clarification offered   ? ?Clinical Observations/Individualized Feedback: Pt attended and participated in group session.  ?  ? ?Plan: Continue to engage patient in RT group sessions 2-3x/week. ? ? ?Victorino Sparrow, LRT,CTRS ?04/08/2021 11:43 AM ?

## 2021-04-08 NOTE — Progress Notes (Signed)
26 year old admitted behavioral health for suicidal ideation. ?Medical team received a call due to abnormal thyroid function test.  TSH low, elevated T4.  Patient is overall otherwise asymptomatic at this time. ?We will order TSI, TraB, thyroid antibodies. ?If abnormal, recommend endocrinology consult and outpatient follow-up.  Otherwise repeat lab work outpatient in 3-4 weeks. ? ?Spoke with Dr Leone Haven.  ? ?Please call with any further questions as needed. ? ?Stephania Fragmin MD ?

## 2021-04-08 NOTE — Progress Notes (Signed)
Did not attend wrap up group

## 2021-04-08 NOTE — [facility] Group Notes (Signed)
Adult Psychoeducational Group Note ? ?Date:  04/08/2021 ?Time:  2:39 PM ? ?Group Topic/Focus:  ?Wellness Toolbox:   The focus of this group is to discuss various aspects of wellness, balancing those aspects and exploring ways to increase the ability to experience wellness.  Patients will create a wellness toolbox for use upon discharge. ? ?Participation Level:  Active ? ?Participation Quality:  Attentive ? ?Affect:  Appropriate ? ?Cognitive:  Alert ? ?Insight: Appropriate ? ?Engagement in Group:  Engaged ? ?Modes of Intervention:  Activity ? ?Additional Comments:  Patient attended and participated in the relaxation group activity. ? ?Jearl Klinefelter ?04/08/2021, 2:39 PM ?

## 2021-04-08 NOTE — BH IP Treatment Plan (Signed)
Interdisciplinary Treatment and Diagnostic Plan Update ? ?04/08/2021 ?Time of Session: 9:25am  ?Alisha Washington ?MRN: [id number] ? 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 ?Secondary Diagnoses: Principal Problem: ?  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 ? ?Current Medications:  ?Current Facility-Administered Medications  ?Medication Dose Route Frequency Provider Last Rate Last Admin  ? acetaminophen (TYLENOL) tablet 650 mg  650 mg Oral Q6H PRN Rozetta Nunnery, NP      ? alum & mag hydroxide-simeth (MAALOX/MYLANTA) 200-200-20 MG/5ML suspension 30 mL  30 mL Oral Q4H PRN Rozetta Nunnery, NP      ? hydrOXYzine (ATARAX) tablet 25 mg  25 mg Oral TID PRN Rozetta Nunnery, NP      ? magnesium hydroxide (MILK OF MAGNESIA) suspension 30 mL  30 mL Oral Daily PRN Lindon Romp A, NP      ? nicotine (NICODERM CQ - dosed in mg/24 hours) patch 14 mg  14 mg Transdermal Daily PRN Massengill, Ovid Curd, MD      ? sertraline (ZOLOFT) tablet 50 mg  50 mg Oral Daily Massengill, Ovid Curd, MD   50 mg at 04/08/21 9798  ? traZODone (DESYREL) tablet 50 mg  50 mg Oral QHS Armando Reichert, MD      ? ?PTA Medications: ?No medications prior to admission.  ? ? ?Patient Stressors: Other: Patient states "none"   ? ?Patient Strengths: General fund of knowledge  ?Physical Health  ?Work skills  ? ?Treatment Modalities: Medication Management, Group therapy, Case management,  ?1 to 1 session with clinician, Psychoeducation, Recreational therapy. ? ? 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 ?Short Term Goals: Ability to identify changes in lifestyle to reduce recurrence of condition will improve ?Ability to verbalize feelings will improve ?Ability to disclose and discuss suicidal ideas ?Ability to demonstrate self-control will improve ?Ability to identify and develop effective coping behaviors will improve ?Ability to maintain  clinical measurements within normal limits will improve ?Compliance with prescribed medications will improve ?Ability to identify triggers associated with substance abuse/mental health issues will improve ? ?Medication Management: Evaluate patient's response, side effects, and tolerance of medication regimen. ? ?Therapeutic Interventions: 1 to 1 sessions, Unit Group sessions and Medication administration. ? ?Evaluation of Outcomes: Not Met ? ?Physician Treatment Plan for Secondary Diagnosis: Principal Problem: ?  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 ?Long Term Goal(s): Improvement in symptoms so as ready for discharge  ? ?Short Term Goals: Ability to identify changes in lifestyle to reduce recurrence of condition will improve ?Ability to verbalize feelings will improve ?Ability to disclose and discuss suicidal ideas ?Ability to demonstrate self-control will improve ?Ability to identify and develop effective coping behaviors will improve ?Ability to maintain clinical measurements within normal limits will improve ?Compliance with prescribed medications will improve ?Ability to identify triggers associated with substance abuse/mental health issues will improve    ? ?Medication Management: Evaluate patient's response, side effects, and tolerance of medication regimen. ? ?Therapeutic Interventions: 1 to 1 sessions, Unit Group sessions and Medication administration. ? ?Evaluation of Outcomes: Not Met ? ? 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 ?Short Term Goals: Ability to remain free from injury will improve, Ability to participate in decision making will improve, Ability to verbalize feelings will improve, Ability to disclose and discuss suicidal ideas, and Ability to  identify and develop effective coping behaviors will improve ? ?Medication Management: RN will administer  medications as ordered by provider, will assess and evaluate patient's response and provide education to patient for prescribed medication. RN will report any adverse and/or side effects to prescribing provider. ? ?Therapeutic Interventions: 1 on 1 counseling sessions, Psychoeducation, Medication administration, Evaluate responses to treatment, Monitor vital signs and CBGs as ordered, Perform/monitor CIWA, COWS, AIMS and Fall Risk screenings as ordered, Perform wound care treatments as ordered. ? ?Evaluation of Outcomes: Not Met ? ? 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 ? ?Short Term Goals: Engage patient in aftercare planning with referrals and resources, Increase social support, Increase emotional regulation, Facilitate acceptance of mental health diagnosis and concerns, Identify triggers associated with mental health/substance abuse issues, and Increase skills for wellness and recovery ? ?Therapeutic Interventions: Assess for all discharge needs, 1 to 1 time with Education officer, museum, Explore available resources and support systems, Assess for adequacy in community support network, Educate family and significant other(s) on suicide prevention, Complete Psychosocial Assessment, Interpersonal group therapy. ? ?Evaluation of Outcomes: Not Met ? ? ?Progress in Treatment: ?Attending groups: Yes. ?Participating in groups: Yes. ?Taking medication as prescribed: Yes. ?Toleration medication: Yes. ?Family/Significant other contact made: No, will contact:  Declined Consents  ?Patient understands diagnosis: No. ?Discussing patient identified problems/goals with staff: Yes. ?Medical problems stabilized or resolved: Yes. ?Denies suicidal/homicidal ideation: Yes. ?Issues/concerns per patient self-inventory: No. ? ? ?New problem(s)  identified: No, Describe:  None ? ?New Short Term/Long Term Goal(s): medication stabilization, elimination of SI thoughts, development of comprehensive mental wellness plan.  ? ?Patient Goals: "To go home"  ? ?Discharge Plan or Barriers: Patient recently admitted. CSW will continue to follow and assess for appropriate referrals and possible discharge planning.  ? ?Reason for Continuation of Hospitalization: Anxiety ?Depression ?Medication stabilization ?Suicidal ideation ? ?Estimated Length of Stay: 3 to 5 days  ? ? ?Scribe for Treatment Team: ?Darleen Crocker, Latanya Presser ?04/08/2021 ?2:00 PM ?

## 2021-04-09 LAB — THYROID ANTIBODIES
Thyroglobulin Antibody: 28.4 IU/mL — ABNORMAL HIGH (ref 0.0–0.9)
Thyroperoxidase Ab SerPl-aCnc: 416 IU/mL — ABNORMAL HIGH (ref 0–34)

## 2021-04-09 LAB — THYROID STIMULATING IMMUNOGLOBULIN: Thyroid Stimulating Immunoglob: 0.76 IU/L — ABNORMAL HIGH (ref 0.00–0.55)

## 2021-04-09 LAB — THYROTROPIN RECEPTOR AUTOABS: Thyrotropin Receptor Ab: 2.28 IU/L — ABNORMAL HIGH (ref 0.00–1.75)

## 2021-04-09 MED ORDER — SERTRALINE HCL 50 MG PO TABS
75.0000 mg | ORAL_TABLET | Freq: Every day | ORAL | Status: DC
  Administered 2021-04-10: 75 mg via ORAL
  Filled 2021-04-09 (×2): qty 1

## 2021-04-09 NOTE — Progress Notes (Signed)
[facility] MD Progress Note  04/09/2021 11:33 AM Alisha Washington  MRN:  [id number] Subjective:  Patient is a 26 year old female with a past psychiatric history of major depressive disorder, who was admitted to the psychiatric unit from the [facility] for evaluation and treatment of suicidal thoughts with plan to cut herself or burned charcoal to breathe it in.  Chart review from last 24 hours-The patient's chart was reviewed and nursing notes were reviewed. Patient discussed in progression rounds with treatment team. MAR was reviewed and Pt is complaint  with scheduled medications and did not require any PRN medications.    Pt is seen and examined today.  Patient is still guarded and does not share much information. She has fair eye contact.  Pt states her mood is same and depressed. She denies any anxiety. Pt states she slept well last night.  Pt states her appetite is fair.  She did not talk to any of the family members on phone.  Patient is not future oriented.  When asked how she sees herself 5 years from now.  She states she does not even think that far.  Currently, patient denies any active or passive suicidal thoughts.  She states the last had suicidal thoughts was yesterday afternoon.  She denies having any plan.  Pt denies homicidal ideation and, visual and auditory hallucination. She denies paranoia. Pt denies any headache, nausea, vomiting, dizziness, chest pain, SOB, abdominal pain, diarrhea, and constipation. Pt denies any medication side effects and has been tolerating it well.  She has been attending groups.  Discussed that we consulted IM and they ordered thyroid antibodies.  Discussed that if it is abnormal she will have to follow-up with her PCP. She verbalizes understanding.  She asks if she is prediabetic. I told her that her blood sugar was normal at admission but she should follow-up with her PCP regularly.   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 The direct patient care time included face-to-face time with the patient, reviewing the patient's chart, communicating with other professionals, and coordinating care. Greater than 50% of this time was spent in counseling or coordinating care with the patient regarding goals of hospitalization, psycho-education, and discharge planning needs.  Past Psychiatric History: see H&P  Past Medical History:  Past Medical History:  Diagnosis Date   Deliberate self-cutting    Panic attacks    Scoliosis    Seasonal allergies    History reviewed. No pertinent surgical history. Family History:  Family History  Problem Relation Age of Onset   Diabetes Father    Family Psychiatric  History: See H&P Social History:  Social History   Substance and Sexual Activity  Alcohol Use No     Social History   Substance and Sexual Activity  Drug Use Yes   Types: Marijuana    Social History   Socioeconomic History   Marital status: Single    Spouse name: Not on file   Number of children: Not on file   Years of education: Not on file   Highest education level: Not on file  Occupational History   Not on file  Tobacco Use   Smoking status: Never   Smokeless tobacco: Never  Vaping Use   Vaping Use: Every day   Substances: Nicotine  Substance and Sexual Activity   Alcohol use:  No   Drug use: Yes    Types: Marijuana   Sexual activity: Yes    Birth control/protection: Condom  Other Topics Concern   Not on file  Social History Narrative   Not on file   Social Determinants of Health   Financial Resource Strain: Not on file  Food Insecurity: Not on file  Transportation Needs: Not on file  Physical Activity: Not on file  Stress: Not on file  Social Connections: Not on file   Additional Social History:                          Sleep: Good  Appetite:  Fair  Current Medications: Current Facility-Administered Medications  Medication Dose Route Frequency Provider Last Rate Last Admin   acetaminophen (TYLENOL) tablet 650 mg  650 mg Oral Q6H PRN Rozetta Nunnery, NP       alum & mag hydroxide-simeth (MAALOX/MYLANTA) 200-200-20 MG/5ML suspension 30 mL  30 mL Oral Q4H PRN Rozetta Nunnery, NP       hydrOXYzine (ATARAX) tablet 25 mg  25 mg Oral TID PRN Lindon Romp A, NP       magnesium hydroxide (MILK OF MAGNESIA) suspension 30 mL  30 mL Oral Daily PRN Lindon Romp A, NP       nicotine (NICODERM CQ - dosed in mg/24 hours) patch 14 mg  14 mg Transdermal Daily PRN Massengill, Nathan, MD       sertraline (ZOLOFT) tablet 50 mg  50 mg Oral Daily Massengill, Nathan, MD   50 mg at 04/09/21 [id number]   traZODone (DESYREL) tablet 50 mg  50 mg Oral QHS Armando Reichert, MD   50 mg at 04/08/21 2124    Lab Results:  No results found for this or any previous visit (from the past 46 hour(s)).   Blood Alcohol level:  Lab Results  Component Value Date   ETH <10 04/06/2021   ETH <10 [id number]    Metabolic Disorder Labs: Lab Results  Component Value Date   HGBA1C 5.0 04/06/2021   MPG 96.8 04/06/2021   MPG 105.41 11/21/2016   No results found for: PROLACTIN Lab Results  Component Value Date   CHOL 155 04/06/2021   TRIG 48 04/06/2021   HDL 61 04/06/2021   CHOLHDL 2.5 04/06/2021   VLDL 10 04/06/2021   LDLCALC 84 04/06/2021   LDLCALC 55 11/21/2016    Physical Findings: AIMS: Facial and Oral Movements Muscles of Facial Expression: None, normal Lips and Perioral Area: None, normal Jaw: None, normal Tongue: None, normal,Extremity Movements Upper (arms, wrists, hands, fingers): None, normal Lower (legs, knees, ankles, toes): None, normal, Trunk Movements Neck, shoulders, hips: None, normal, Overall Severity Severity of abnormal movements (highest score from questions above): None, normal Incapacitation due to abnormal  movements: None, normal Patient's awareness of abnormal movements (rate only patient's report): No Awareness, Dental Status Current problems with teeth and/or dentures?: No Does patient usually wear dentures?: No  CIWA:    COWS:     Musculoskeletal: Strength & Muscle Tone: within normal limits Gait & Station: normal Patient leans: N/A  Psychiatric Specialty Exam:  Presentation  General Appearance: Appropriate for Environment  Eye Contact:Fair  Speech:Normal Rate  Speech Volume:Decreased  Handedness:Right   Mood and Affect  Mood:Depressed  Affect:Congruent; Restricted   Thought Process  Thought Processes:Coherent; Linear; Goal Directed Guarded , doesn't share much information.  Descriptions of Associations:Intact  Orientation:Full (Time, Place and Person)  Thought Content:Logical; WDL  History of Schizophrenia/Schizoaffective disorder:No  Duration of Psychotic Symptoms:N/A  Hallucinations:Hallucinations: None  Ideas of Reference:None  Suicidal Thoughts:Suicidal Thoughts: No (Last had yesterday afternoon) SI Active Intent and/or Plan: Without Plan; Without Means to Carry Out  Homicidal Thoughts:Homicidal Thoughts: No   Sensorium  Memory:Immediate Good; Recent Good; Remote Good  Judgment:Poor  Insight:Poor   Executive Functions  Concentration:Fair  Attention Span:Fair  [place] (Does not share much information)   Psychomotor Activity  Psychomotor Activity:Psychomotor Activity: Normal   Assets  Assets:Communication Skills; Desire for Improvement; Financial Resources/Insurance; Physical Health   Sleep  Sleep:Sleep: Good    Physical Exam: Physical Exam Vitals and nursing note reviewed.  Constitutional:      General: She is not in acute distress.    Appearance: Normal appearance. She is not ill-appearing, toxic-appearing or diaphoretic.  Pulmonary:     Effort: Pulmonary effort is normal.   Neurological:     General: No focal deficit present.     Mental Status: She is alert and oriented to person, place, and time.   Review of Systems  Constitutional:  Negative for chills and fever.  Respiratory:  Negative for cough and shortness of breath.   Cardiovascular:  Negative for chest pain.  Gastrointestinal:  Negative for abdominal pain, diarrhea, heartburn, nausea and vomiting.  Neurological:  Negative for dizziness and headaches.  Psychiatric/Behavioral:  Positive for depression and suicidal ideas. Negative for hallucinations. The patient is nervous/anxious and has insomnia.   Blood pressure 138/71, pulse 99, temperature 98.1 F (36.7 C), temperature source Oral, resp. rate 14, height 5\' 5"  (1.651 m), weight 90.3 kg, last menstrual period 04/06/2021, SpO2 99 %. Body mass index is 33.12 kg/m.   Treatment Plan Summary:Patient is a 26 year old female with a past psychiatric history of major depressive disorder, who was admitted to the psychiatric unit from the [facility] for evaluation and treatment of suicidal thoughts with plan to cut herself or burned charcoal to breathe it in.  Patient is still guarded, does not share much information.  Still reporting depression and off-and-on suicidal ideations without a plan.  Patient is not future oriented. Will increase Zoloft to 75 mg tomorrow and  to 100 mg on Saturday.   Daily contact with patient to assess and evaluate symptoms and progress in treatment ASSESSMENT:   Diagnoses / Active Problems: -Major depressive disorder, recurrent, severe, without psychotic features -GAD with panic attacks -PTSD New Labs reviewed: T3 within normal limits, TSI, TraB, thyroid antibodies-pending PLAN: 2. Psychiatric Diagnoses and Treatment:               - Increase Zoloft to 75 mg tomorrow. Will increase to 100 mg on Saturday. for MDD, GAD, PTSD.  -Continue scheduled trazodone  50 mg nightly.              3. Medical Issues Being Addressed:               Tobacco Use Disorder             -- Nicotine patch 14mg /24 hours ordered             -- Smoking cessation encouraged  4. Abnormal thyroid labs: Low TSH, elevated T4, T3 within normal limits -Consulted IM and spoke to Dr Reesa Chew- order TSI, TraB, thyroid antibodies. Recommended  that If abnormal, recommend endocrinology consult and outpatient follow-up.  Otherwise repeat lab work outpatient in 3-4 weeks. -vitals wnl at present time    4. Discharge Planning:              --  Social work and case management to assist with discharge planning and identification of hospital follow-up needs prior to discharge          Armando Reichert, MD PGY2 04/09/2021, 11:33 AM

## 2021-04-09 NOTE — [facility] Group Notes (Signed)
[facility] Group Notes:  (Nursing/MHT/Case Management/Adjunct) ? ?Date:  04/09/2021  ?Time:  8:38 PM ? ?Type of Therapy:   Wrap up ? ?Participation Level:  Active ? ?Participation Quality:  Appropriate and Attentive ? ?Affect:  Appropriate ? ?Cognitive:  Alert and Appropriate ? ?Insight:  Appropriate, Good, and Improving ? ?Engagement in Group:  Developing/Improving ? ?Modes of Intervention:  Discussion ? ?Summary of Progress/Problems: pt attended group. She was in a positive mood. She said she wanted to work on a discharge plan. ? ?Alisha Washington ?04/09/2021, 8:38 PM ?

## 2021-04-09 NOTE — Group Note (Signed)
Date:  04/09/2021 ?Time:  9:54 AM ? ?Group Topic/Focus:  ?Orientation:   The focus of this group is to educate the patient on the purpose and policies of crisis stabilization and provide a format to answer questions about their admission.  The group details unit policies and expectations of patients while admitted. ? ? ? ?Participation Level:  Active ? ?Participation Quality:  Appropriate ? ?Affect:  Appropriate ? ?Cognitive:  Appropriate ? ?Insight: Appropriate ? ?Engagement in Group:  Engaged ? ?Modes of Intervention:  Discussion ? ?Additional Comments:   ? ?Alisha Washington ?04/09/2021, 9:54 AM ? ?

## 2021-04-09 NOTE — Progress Notes (Signed)
DAR NOTE: Patient presents with calm affect and depressed mood.  Denies suicidal thoughts, auditory and visual hallucinations.  Rates depression at 7, hopelessness at 10, and anxiety at 3.  Maintained on routine safety checks.  Medications given as prescribed.  Support and encouragement offered as needed.  Attended group and participated.  States goal for today is "to go home."  Patient visible in milieu with minimal interaction.  Patient is safe on and off the unit.  Offered no complaint.   ? ?

## 2021-04-10 MED ORDER — SERTRALINE HCL 100 MG PO TABS
100.0000 mg | ORAL_TABLET | Freq: Every day | ORAL | Status: DC
  Administered 2021-04-11 – 2021-04-13 (×3): 100 mg via ORAL
  Filled 2021-04-10 (×4): qty 1
  Filled 2021-04-10: qty 7

## 2021-04-10 MED ORDER — PROPRANOLOL HCL 10 MG PO TABS
10.0000 mg | ORAL_TABLET | Freq: Two times a day (BID) | ORAL | Status: DC
Start: 2021-04-10 — End: 2021-04-13
  Administered 2021-04-10 – 2021-04-13 (×4): 10 mg via ORAL
  Filled 2021-04-10 (×2): qty 1
  Filled 2021-04-10: qty 14
  Filled 2021-04-10 (×3): qty 1
  Filled 2021-04-10: qty 14
  Filled 2021-04-10 (×4): qty 1

## 2021-04-10 MED ORDER — TRAZODONE HCL 100 MG PO TABS
100.0000 mg | ORAL_TABLET | Freq: Every day | ORAL | Status: DC
Start: 2021-04-10 — End: 2021-04-13
  Administered 2021-04-10 – 2021-04-12 (×3): 100 mg via ORAL
  Filled 2021-04-10 (×2): qty 1
  Filled 2021-04-10: qty 7
  Filled 2021-04-10 (×2): qty 1

## 2021-04-10 NOTE — Group Note (Signed)
[facility] LCSW Group Therapy Note ? ?Date/Time: 04/10/2021 @ 1pm ? ?Type of Therapy and Topic:  Group Therapy:  Who Am I?  Self Esteem, Self-Actualization and Understanding Self. ? ?Participation Level:  Active  ? ?Description of Group:   ? In this group patients will be asked to explore values, beliefs, truths, and morals as they relate to personal self.  Patients will be guided to discuss their thoughts, feelings, and behaviors related to what they identify as important to their true self. Patients will process together how values, beliefs and truths are connected to specific choices patients make every day. Each patient will be challenged to identify changes that they are motivated to make in order to improve self-esteem and self-actualization. This group will be process-oriented, with patients participating in exploration of their own experiences as well as giving and receiving support and challenge from other group members. ? ?Therapeutic Goals: ?Patient will identify false beliefs that currently interfere with their self-esteem.  ?Patient will identify feelings, thought process, and behaviors related to self and will become aware of the uniqueness of themselves and of others.  ?Patient will be able to identify and verbalize values, morals, and beliefs as they relate to self. ?Patient will begin to learn how to build self-esteem/self-awareness by expressing what is important and unique to them personally. ? ?Summary of Patient Progress: Patient participated appropriately in group. Patient identified sister as an important person in her life. Patient participated in discussion questions associated with group topic and shared values important to her.  ? ? ? ? ?Therapeutic Modalities:   ?Cognitive Behavioral Therapy ?Solution Focused Therapy ?Motivational Interviewing ?Brief Therapy ? ? ?Lorence Nagengast, LCSW, LCAS ?Clincal Social Worker  ?[facility] ? ? ?

## 2021-04-10 NOTE — Group Note (Signed)
Date:  04/10/2021 ?Time:  10:03 AM ? ?Group Topic/Focus:  ?Orientation:   The focus of this group is to educate the patient on the purpose and policies of crisis stabilization and provide a format to answer questions about their admission.  The group details unit policies and expectations of patients while admitted. ? ? ? ?Participation Level:  Active ? ?Participation Quality:  Appropriate ? ?Affect:  Appropriate ? ?Cognitive:  Appropriate ? ?Insight: Appropriate ? ?Engagement in Group:  Engaged ? ?Modes of Intervention:  Discussion ? ?Additional Comments: Engaged in self reporting and goal setting. ? ?Jerrye Beavers ?04/10/2021, 10:03 AM ? ?

## 2021-04-10 NOTE — Progress Notes (Shared)
Patient ID: Alisha Washington, female   DOB: Jan 22, 1996, 26 y.o.   MRN: [id number] ? ?Subjective: Patient is a 26 year old female with a past psychiatric history of major depressive disorder, who was admitted to the psychiatric unit from the [facility] for evaluation and treatment of suicidal thoughts with plan to cut herself or burned charcoal to breathe it in. ? ?Chart review from last 24 hours: The patient's chart was reviewed and nursing notes were reviewed. Patient discussed in progression rounds with treatment team. MAR was reviewed and Pt is complaint with scheduled medications and did not require any PRN medications. ? ?Patient is seen and examined today. She is still guarded and does not share much information. She has fair eye contact. She states that her mood is better and she cannot identify a reason why. She reports that her sleep was not great last night. Currently patient denies any active or passive suicidal thoughts. She reports that she had suicidal ideation this morning but those quickly went away. She denies having any plan. Pt denies homicidal ideation, visual and auditory hallucinations, or delusions of paranoia. She states that she is anxious and reports the hospital stay as her source of anxiety. She denies any side effects to medications and has been tolerating them well. Pt reports that her appetite is better. She has been attending and participating in groups. Pt identifies several coping skills including watching TV, doing crossword puzzles, and other activities to keep her mind preoccupied. Discussed other coping skills such as writing, doing puzzles, and reading. Pt has not talked to anyone on the phone. Yesterday, pt was asked to write down and share five things that she loves about herself. When asked to read them out loud, she began to cry because there were too many people in the room. Discussed following-up with her one-on-one later this afternoon. Discussed that we are adjusting her medications  and are considering increasing her Trazodone and adding a medication for anxiety. ?

## 2021-04-10 NOTE — Progress Notes (Signed)
?   04/10/21 0805  ?Psych Admission Type (Psych Patients Only)  ?Admission Status Voluntary  ?Psychosocial Assessment  ?Patient Complaints Apathy  ?Eye Contact Fair  ?Facial Expression Flat  ?Affect Anxious  ?Speech Logical/coherent  ?Interaction Cautious  ?Motor Activity Slow  ?Appearance/Hygiene Unremarkable  ?Behavior Characteristics Cooperative  ?Mood Depressed  ?Thought Process  ?Coherency WDL  ?Content WDL  ?Delusions None reported or observed  ?Perception WDL  ?Hallucination None reported or observed  ?Judgment Impaired  ?Confusion None  ?Danger to Self  ?Current suicidal ideation? Denies  ?Danger to Others  ?Danger to Others None reported or observed  ? ? ?

## 2021-04-10 NOTE — Progress Notes (Signed)
?   04/10/21 2128  ?Psych Admission Type (Psych Patients Only)  ?Admission Status Voluntary  ?Psychosocial Assessment  ?Patient Complaints Anxiety  ?Eye Contact Fair  ?Facial Expression Other (Comment) ?(Appropriate)  ?Affect Appropriate to circumstance  ?Speech Logical/coherent  ?Interaction Assertive  ?Motor Activity Other (Comment) ?(WDL)  ?Appearance/Hygiene Unremarkable  ?Behavior Characteristics Cooperative;Appropriate to situation  ?Mood Depressed  ?Thought Process  ?Coherency WDL  ?Content WDL  ?Delusions None reported or observed  ?Perception WDL  ?Hallucination None reported or observed  ?Judgment Impaired  ?Confusion None  ?Danger to Self  ?Current suicidal ideation? Denies  ?Self-Injurious Behavior No self-injurious ideation or behavior indicators observed or expressed   ?Agreement Not to Harm Self Yes  ?Description of Agreement Verbal contract  ?Danger to Others  ?Danger to Others None reported or observed  ? ? ?

## 2021-04-10 NOTE — Progress Notes (Signed)
[facility] MD Progress Note  04/10/2021 3:31 PM Narely Nobles  MRN:  [id number] Subjective:  Patient is a 26 year old female with a past psychiatric history of major depressive disorder, who was admitted to the psychiatric unit from the [facility] for evaluation and treatment of suicidal thoughts with plan to cut herself or burned charcoal to breathe it in.  Chart review from last 24 hours: The patient's chart was reviewed and nursing notes were reviewed. Patient discussed in progression rounds with treatment team. MAR was reviewed and Pt is complaint with scheduled medications and did not require any PRN medications.   Patient is seen and examined today. She is still guarded and does not share much information. She has fair eye contact. She states that her mood is better and she cannot identify a reason why. She reports that her sleep was not great last night. Currently patient denies any active or passive suicidal thoughts. She reports that she had suicidal ideation this morning but those quickly went away. She denies having any plan. Pt denies homicidal ideation, visual and auditory hallucinations, or delusions of paranoia. She states that she is anxious and reports the hospital stay as her source of anxiety. She denies any side effects to medications and has been tolerating them well. Pt reports that her appetite is better. She has been attending and participating in groups. Pt identifies several coping skills including watching TV, doing crossword puzzles, and other activities to keep her mind preoccupied. Discussed other coping skills such as writing, doing puzzles, and reading. Pt has not talked to anyone on the phone. Yesterday, pt was asked to write down and share five things that she loves about herself. When asked to read them out loud, she began to cry because there were too many people in the room. Discussed following-up with her one-on-one later this afternoon. Discussed that we are adjusting her medications and  are considering increasing her Trazodone and adding a medication for anxiety.  Met with patient again in the afternoon.-Patient shares that her 5 positive things are her hair, freckles, that she cares about people, she is nice and her handwriting.  She states that she was feeling anxious earlier and she does not like talking to multiple people.   Principal Problem: Severe recurrent major depression ([place]) Diagnosis: Principal Problem:   Severe recurrent major depression ([place])  Total Time spent with patient: 30 minutes I personally spent 30 minutes on the unit in direct patient care. The direct patient care time included face-to-face time with the patient, reviewing the patient's chart, communicating with other professionals, and coordinating care. Greater than 50% of this time was spent in counseling or coordinating care with the patient regarding goals of hospitalization, psycho-education, and discharge planning needs.  Past Psychiatric History: see H&P  Past Medical History:  Past Medical History:  Diagnosis Date   Deliberate self-cutting    Panic attacks    Scoliosis    Seasonal allergies    History reviewed. No pertinent surgical history. Family History:  Family History  Problem Relation Age of Onset   Diabetes Father    Family Psychiatric  History: See H&P Social History:  Social History   Substance and Sexual Activity  Alcohol Use No     Social History   Substance and Sexual Activity  Drug Use Yes   Types: Marijuana    Social History   Socioeconomic History   Marital status: Single    Spouse name: Not on file   Number of children: Not on  file   Years of education: Not on file   Highest education level: Not on file  Occupational History   Not on file  Tobacco Use   Smoking status: Never   Smokeless tobacco: Never  Vaping Use   Vaping Use: Every day   Substances: Nicotine  Substance and Sexual Activity   Alcohol use: No   Drug use: Yes    Types: Marijuana    Sexual activity: Yes    Birth control/protection: Condom  Other Topics Concern   Not on file  Social History Narrative   Not on file   Social Determinants of Health   Financial Resource Strain: Not on file  Food Insecurity: Not on file  Transportation Needs: Not on file  Physical Activity: Not on file  Stress: Not on file  Social Connections: Not on file   Additional Social History:                         Sleep: fair 5.5 hrs  Appetite:  Good  Current Medications: Current Facility-Administered Medications  Medication Dose Route Frequency Provider Last Rate Last Admin   acetaminophen (TYLENOL) tablet 650 mg  650 mg Oral Q6H PRN Rozetta Nunnery, NP       alum & mag hydroxide-simeth (MAALOX/MYLANTA) 200-200-20 MG/5ML suspension 30 mL  30 mL Oral Q4H PRN Rozetta Nunnery, NP       hydrOXYzine (ATARAX) tablet 25 mg  25 mg Oral TID PRN Rozetta Nunnery, NP       magnesium hydroxide (MILK OF MAGNESIA) suspension 30 mL  30 mL Oral Daily PRN Lindon Romp A, NP       nicotine (NICODERM CQ - dosed in mg/24 hours) patch 14 mg  14 mg Transdermal Daily PRN Massengill, Ovid Curd, MD       propranolol (INDERAL) tablet 10 mg  10 mg Oral BID Armando Reichert, MD       [START ON 04/11/2021] sertraline (ZOLOFT) tablet 100 mg  100 mg Oral Daily Kaydyn Chism, MD       traZODone (DESYREL) tablet 100 mg  100 mg Oral QHS Armando Reichert, MD        Lab Results:  Results for orders placed or performed during the hospital encounter of 04/06/21 (from the past 48 hour(s))  Thyroid antibodies     Status: Abnormal   Collection Time: 04/08/21  6:28 PM  Result Value Ref Range   Thyroperoxidase Ab SerPl-aCnc 416 (H) 0 - 34 IU/mL   Thyroglobulin Antibody 28.4 (H) 0.0 - 0.9 IU/mL    Comment: (NOTE) Thyroglobulin Antibody measured by [facility] Methodology Performed At: [facility] [place], [place] [id number] Rush Farmer MD [id number]   Thyroid stimulating immunoglobulin      Status: Abnormal   Collection Time: 04/08/21  6:28 PM  Result Value Ref Range   Thyroid Stimulating Immunoglob 0.76 (H) 0.00 - 0.55 IU/L    Comment: (NOTE) Performed At: [facility] [address] [place], [place] [id number] Rush Farmer MD [id number]   Thyrotropin receptor autoabs     Status: Abnormal   Collection Time: 04/08/21  6:28 PM  Result Value Ref Range   Thyrotropin Receptor Ab 2.28 (H) 0.00 - 1.75 IU/L    Comment: (NOTE) Performed At: [facility] [address] [place], [place] [id number] Rush Farmer MD [id number]      Blood Alcohol level:  Lab Results  Component Value Date   [facility] <10 04/06/2021  ETH <10 11/57/2620    Metabolic Disorder Labs: Lab Results  Component Value Date   HGBA1C 5.0 04/06/2021   MPG 96.8 04/06/2021   MPG 105.41 11/21/2016   No results found for: PROLACTIN Lab Results  Component Value Date   CHOL 155 04/06/2021   TRIG 48 04/06/2021   HDL 61 04/06/2021   CHOLHDL 2.5 04/06/2021   VLDL 10 04/06/2021   LDLCALC 84 04/06/2021   LDLCALC 55 11/21/2016    Physical Findings: AIMS: Facial and Oral Movements Muscles of Facial Expression: None, normal Lips and Perioral Area: None, normal Jaw: None, normal Tongue: None, normal,Extremity Movements Upper (arms, wrists, hands, fingers): None, normal Lower (legs, knees, ankles, toes): None, normal, Trunk Movements Neck, shoulders, hips: None, normal, Overall Severity Severity of abnormal movements (highest score from questions above): None, normal Incapacitation due to abnormal movements: None, normal Patient's awareness of abnormal movements (rate only patient's report): No Awareness, Dental Status Current problems with teeth and/or dentures?: No Does patient usually wear dentures?: No  CIWA:    COWS:     Musculoskeletal: Strength & Muscle Tone: within normal limits Gait & Station: normal Patient leans: N/A  Psychiatric Specialty Exam:  Presentation   General Appearance: Appropriate for Environment  Eye Contact:Fair  Speech:Normal Rate  Speech Volume:Decreased  Handedness:Right   Mood and Affect  Mood:Anxious; Depressed  Affect:Congruent; Constricted; Tearful   Thought Process  Thought Processes:Coherent Guarded , doesn't share much information.  Descriptions of Associations:Intact  Orientation:Full (Time, Place and Person)  Thought Content:Logical  History of Schizophrenia/Schizoaffective disorder:No  Duration of Psychotic Symptoms:N/A  Hallucinations:Hallucinations: None  Ideas of Reference:None  Suicidal Thoughts:Suicidal Thoughts: No (last had this morning) SI Active Intent and/or Plan: Without Plan  Homicidal Thoughts:Homicidal Thoughts: No   Sensorium  Memory:Immediate Good; Recent Good; Remote Good  Judgment:Poor  Insight:Poor   Executive Functions  Concentration:Fair  Attention Span:Fair  [place]   Psychomotor Activity  Psychomotor Activity:Psychomotor Activity: Normal   Assets  Assets:Communication Skills; Desire for Improvement; Financial Resources/Insurance; Physical Health   Sleep  Sleep:Sleep: Fair    Physical Exam: Physical Exam Vitals and nursing note reviewed.  Constitutional:      General: She is not in acute distress.    Appearance: Normal appearance. She is not ill-appearing, toxic-appearing or diaphoretic.  Pulmonary:     Effort: Pulmonary effort is normal.  Neurological:     General: No focal deficit present.     Mental Status: She is alert and oriented to person, place, and time.   Review of Systems  Constitutional:  Negative for chills and fever.  Respiratory:  Negative for cough and shortness of breath.   Cardiovascular:  Negative for chest pain.  Gastrointestinal:  Negative for abdominal pain, diarrhea, heartburn, nausea and vomiting.  Neurological:  Negative for dizziness and headaches.   Psychiatric/Behavioral:  Positive for depression. Negative for hallucinations and suicidal ideas. The patient is nervous/anxious and has insomnia.        Lats had SI this morning, without plan or intent.  Blood pressure 133/83, pulse (!) 105, temperature 97.6 F (36.4 C), resp. rate 18, height $RemoveBe'5\' 5"'OEgaVEncz$  (1.651 m), weight 90.3 kg, last menstrual period 04/06/2021, SpO2 96 %. Body mass index is 33.12 kg/m.   Treatment Plan Summary:Patient is a 26 year old female with a past psychiatric history of major depressive disorder, who was admitted to the psychiatric unit from the [facility] for evaluation and treatment of suicidal thoughts with plan to cut herself or burned  charcoal to breathe it in.  Patient is still guarded, does not share much information.  Still reporting depression and off-and-on suicidal ideations without a plan.  Patient is not future oriented. Will increase Zoloft to 75 mg tomorrow and  to 100 mg on Saturday.   Daily contact with patient to assess and evaluate symptoms and progress in treatment ASSESSMENT:   Diagnoses / Active Problems: -Major depressive disorder, recurrent, severe, without psychotic features -GAD with panic attacks -PTSD New Labs reviewed: TSI-0.76 high, thyroperoxidase antibody high at 416, thyroglobulin antibody high at 28.4, thyroidtropin receptor antibodies-2.28 high PLAN: 2. Psychiatric Diagnoses and Treatment:               - Increase Zoloft to 75 mg today. Will increase to 100 mg tomorrow. for MDD, GAD, PTSD.  -Increase scheduled trazodone to 100 mg nightly. -Start propanolol 10 mg twice daily for anxiety.              3. Medical Issues Being Addressed:              Tobacco Use Disorder             -- Nicotine patch $RemoveBefore'14mg'ffLsgoMZjzAFg$ /24 hours ordered             -- Smoking cessation encouraged  4. Abnormal thyroid labs: Low TSH, elevated T4, T3 within normal limits, TSI-0.76 high, thyroperoxidase antibody high at 416, thyroglobulin antibody high at 28.4, thyroidtropin  receptor antibodies-2.28 high -Consulted IM and spoke to Dr Reesa Chew- order TSI, TraB, thyroid antibodies. Recommended  that If abnormal, recommend endocrinology consult and outpatient follow-up.  Otherwise repeat lab work outpatient in 3-4 weeks. -Recommend patient to follow-up with endocrinology on outpatient basis. -BP normal, with mild tachycardia at present time    4. Discharge Planning:              -- Social work and case management to assist with discharge planning and identification of hospital follow-up needs prior to discharge          Armando Reichert, MD PGY2 04/10/2021, 3:31 PM

## 2021-04-10 NOTE — Progress Notes (Signed)
?   04/09/21 2124  ?Psych Admission Type (Psych Patients Only)  ?Admission Status Voluntary  ?Psychosocial Assessment  ?Patient Complaints None  ?Eye Contact Fair  ?Facial Expression Flat  ?Affect Appropriate to circumstance  ?Speech Logical/coherent;Soft  ?Interaction Assertive  ?Motor Activity Other (Comment) ?(WDL)  ?Appearance/Hygiene Unremarkable  ?Behavior Characteristics Cooperative;Appropriate to situation  ?Mood Depressed  ?Thought Process  ?Coherency WDL  ?Content WDL  ?Delusions None reported or observed  ?Perception WDL  ?Hallucination None reported or observed  ?Judgment Impaired  ?Confusion None  ?Danger to Self  ?Current suicidal ideation? Denies  ?Danger to Others  ?Danger to Others None reported or observed  ? ? ?

## 2021-04-10 NOTE — Group Note (Signed)
Recreation Therapy Group Note ? ? ?Group Topic:Stress Management  ?Group Date: 04/10/2021 ?Start Time: 41 ?End Time: 7628 ?Facilitators: Caroll Rancher, LRT,CTRS ?Location: [address] Day[address] ? ? ?Goal Area(s) Addresses:  ?Patient will identify positive stress management techniques. ?Patient will identify benefits of using stress management post d/c. ? ?Group Description:  Meditation.  LRT played a mountain meditation which patients were encouraged to visualize a mountain and imagine taking on its qualities.  The meditation spoke of the mountain being able to stand through anything its faced with and still keep the qualities it has.  Patients were to listen and follow along as meditation played to fully engage in activity. ? ? ? ?Affect/Mood: Appropriate ?  ?Participation Level: Active ?  ?Participation Quality: Independent ?  ?Behavior: Attentive  ?  ?Speech/Thought Process: Focused ?  ?Insight: Good ?  ?Judgement: Good ?  ?Modes of Intervention: Meditation ?  ?Patient Response to Interventions:  Attentive ?  ?Education Outcome: ? Acknowledges education and In group clarification offered   ? ?Clinical Observations/Individualized Feedback: Pt attended and participated in group session.  ? ? ?Plan: Continue to engage patient in RT group sessions 2-3x/week. ? ? ?Caroll Rancher, LRT, CTRS ?04/10/2021 12:18 PM ?

## 2021-04-11 DIAGNOSIS — F913 Oppositional defiant disorder: Secondary | ICD-10-CM

## 2021-04-11 DIAGNOSIS — F32A Depression, unspecified: Secondary | ICD-10-CM

## 2021-04-11 NOTE — [facility] Group Notes (Signed)
Goals Group ?3/4//2023 ? ? ?Group Focus: affirmation, clarity of thought, and goals/reality orientation ?Treatment Modality:  Psychoeducation ?Interventions utilized were assignment, group exercise, and support ?Purpose: To be able to understand and verbalize the reason for their admission to the hospital. To understand that the medication helps with their chemical imbalance but they also need to work on their choices in life. To be challenged to develop a list of 30 positives about themselves. Also introduce the concept that "feelings" are not reality. ? ?Participation Level:  Active ? ?Participation Quality:  Appropriate ? ?Affect:  Appropriate ? ?Cognitive:  Appropriate ? ?Insight:  Improving ? ?Engagement in Group:  Engaged ? ?Additional Comments:  Pt rates herself at a 6.5. Attended and fully participated. ? ?Alisha Washington A ?

## 2021-04-11 NOTE — Progress Notes (Signed)
?   04/11/21 2139  ?Psych Admission Type (Psych Patients Only)  ?Admission Status Voluntary  ?Psychosocial Assessment  ?Patient Complaints None  ?Eye Contact Fair  ?Facial Expression Other (Comment) ?(Appropriate)  ?Affect Appropriate to circumstance  ?Speech Logical/coherent  ?Interaction Assertive  ?Motor Activity Other (Comment) ?(WDL)  ?Appearance/Hygiene Unremarkable  ?Behavior Characteristics Cooperative;Appropriate to situation  ?Mood Depressed;Pleasant  ?Thought Process  ?Coherency WDL  ?Content WDL  ?Delusions None reported or observed  ?Perception WDL  ?Hallucination None reported or observed  ?Judgment Impaired  ?Confusion None  ?Danger to Self  ?Current suicidal ideation? Denies  ?Self-Injurious Behavior No self-injurious ideation or behavior indicators observed or expressed   ?Agreement Not to Harm Self Yes  ?Description of Agreement Verbal contract  ?Danger to Others  ?Danger to Others None reported or observed  ? ? ?

## 2021-04-11 NOTE — [facility] Group Notes (Signed)
Psychoeducational Group Note ? ? ? ?Date:04/11/21 ?Time: 1300-1400 ? ? ? ?Purpose of Group: . The group focus' on teaching patients on how to identify their needs and their Life Skills:  A group where two lists are made. What people need and what are things that we do that are unhealthy. The lists are developed by the patients and it is explained that we often do the actions that are not healthy to get our list of needs met. ? ?Goal:: to develop the coping skills needed to get their needs met ? ?Participation Level:  Active ? ?Participation Quality:  Appropriate ? ?Affect:  Appropriate ? ?Cognitive:  Oriented ? ?Insight:  Improving ? ?Engagement in Group:  Engaged ? ?Additional Comments: Rates her energy at a 7/10. Participated fully in the group, contributing to the conversation ? ?Bryson Dames A ? ?

## 2021-04-11 NOTE — Plan of Care (Signed)
  Problem: Education: Goal: Emotional status will improve Outcome: Progressing Goal: Mental status will improve Outcome: Progressing   

## 2021-04-11 NOTE — [facility] Group Notes (Signed)
Pt attended and participated in orientation/goals group ?

## 2021-04-11 NOTE — [facility] Group Notes (Signed)
[facility] Group Notes:  (Nursing/MHT/Case Management/Adjunct) ? ?Date:  04/11/2021  ?Time:  9:40 PM ? ?Type of Therapy:  Group Therapy ? ?Participation Level:  Active ? ?Participation Quality:  Appropriate ? ?Affect:  Appropriate ? ?Cognitive:  Appropriate ? ?Insight:  Appropriate ? ?Engagement in Group:  Engaged ? ?Modes of Intervention:  Activity ? ?Summary of Progress/Problems: ? ?Tacy Dura ?04/11/2021, 9:40 PM ?

## 2021-04-11 NOTE — Progress Notes (Addendum)
[facility] MD Progress Note  04/11/2021 2:04 PM Docia Klar  MRN:  [id number]  Subjective:  Alisha Washington reported " I am doing a lot better today."  Evaluation: Maverick was seen and evaluated face-to-face.  She is denying suicidal or homicidal ideations.  Denies auditory visual hallucinations.  She reports experiencing suicidal ideations " for a while" reports" I know that I needed to help."  States initially she was reluctant to treatment plan however realizes that she needed to be inpatient.  She was initiated on Zoloft for mood stabilization which she reports taking and tolerating well.  States she is open to finding therapy services after discharge.  Discussed following up with partial hospitalization programming and or individual therapy.  Reports strained relationship between she and her parents.  States overall her mother has been "a lot nicer since her near death experience last summer."  Currently denying depressive symptoms.  Denied alcohol cravings.   Brynnlie reports she resides alone," I will have to find things to occupy my time as a distraction"  she reported working on healthier coping skills. patient inquired about anticipated discharge date encouraged to follow-up with attending psychiatrist.  She reports a good appetite.  States she was doing well throughout the night.  Support, encouragement and reassurance was provided.   Principal Problem: Severe recurrent major depression (HCC) Diagnosis: Principal Problem:   Severe recurrent major depression (HCC)  Total Time spent with patient: 15 minutes  Past Psychiatric History:   Past Medical History:  Past Medical History:  Diagnosis Date   Deliberate self-cutting    Panic attacks    Scoliosis    Seasonal allergies    History reviewed. No pertinent surgical history. Family History:  Family History  Problem Relation Age of Onset   Diabetes Father    Family Psychiatric  History:  Social History:  Social History   Substance and Sexual  Activity  Alcohol Use No     Social History   Substance and Sexual Activity  Drug Use Yes   Types: Marijuana    Social History   Socioeconomic History   Marital status: Single    Spouse name: Not on file   Number of children: Not on file   Years of education: Not on file   Highest education level: Not on file  Occupational History   Not on file  Tobacco Use   Smoking status: Never   Smokeless tobacco: Never  Vaping Use   Vaping Use: Every day   Substances: Nicotine  Substance and Sexual Activity   Alcohol use: No   Drug use: Yes    Types: Marijuana   Sexual activity: Yes    Birth control/protection: Condom  Other Topics Concern   Not on file  Social History Narrative   Not on file   Social Determinants of Health   Financial Resource Strain: Not on file  Food Insecurity: Not on file  Transportation Needs: Not on file  Physical Activity: Not on file  Stress: Not on file  Social Connections: Not on file   Additional Social History:             Sleep: Good  Appetite:  Good  Current Medications: Current Facility-Administered Medications  Medication Dose Route Frequency Provider Last Rate Last Admin   acetaminophen (TYLENOL) tablet 650 mg  650 mg Oral Q6H PRN Nira Conn A, NP       alum & mag hydroxide-simeth (MAALOX/MYLANTA) 200-200-20 MG/5ML suspension 30 mL  30 mL Oral Q4H PRN Nira Conn  A, NP       hydrOXYzine (ATARAX) tablet 25 mg  25 mg Oral TID PRN Jackelyn Poling, NP   25 mg at 04/10/21 2128   magnesium hydroxide (MILK OF MAGNESIA) suspension 30 mL  30 mL Oral Daily PRN Nira Conn A, NP       nicotine (NICODERM CQ - dosed in mg/24 hours) patch 14 mg  14 mg Transdermal Daily PRN Massengill, Harrold Donath, MD       propranolol (INDERAL) tablet 10 mg  10 mg Oral BID Karsten Ro, MD   10 mg at 04/10/21 1712   sertraline (ZOLOFT) tablet 100 mg  100 mg Oral Daily Karsten Ro, MD   100 mg at 04/11/21 0819   traZODone (DESYREL) tablet 100 mg  100 mg Oral  QHS Karsten Ro, MD   100 mg at 04/10/21 2128    Lab Results: No results found for this or any previous visit (from the past 48 hour(s)).  Blood Alcohol level:  Lab Results  Component Value Date   ETH <10 04/06/2021   ETH <10 11/20/2016    Metabolic Disorder Labs: Lab Results  Component Value Date   HGBA1C 5.0 04/06/2021   MPG 96.8 04/06/2021   MPG 105.41 11/21/2016   No results found for: PROLACTIN Lab Results  Component Value Date   CHOL 155 04/06/2021   TRIG 48 04/06/2021   HDL 61 04/06/2021   CHOLHDL 2.5 04/06/2021   VLDL 10 04/06/2021   LDLCALC 84 04/06/2021   LDLCALC 55 11/21/2016    Physical Findings: AIMS: Facial and Oral Movements Muscles of Facial Expression: None, normal Lips and Perioral Area: None, normal Jaw: None, normal Tongue: None, normal,Extremity Movements Upper (arms, wrists, hands, fingers): None, normal Lower (legs, knees, ankles, toes): None, normal, Trunk Movements Neck, shoulders, hips: None, normal, Overall Severity Severity of abnormal movements (highest score from questions above): None, normal Incapacitation due to abnormal movements: None, normal Patient's awareness of abnormal movements (rate only patient's report): No Awareness, Dental Status Current problems with teeth and/or dentures?: No Does patient usually wear dentures?: No  CIWA:    COWS:     Musculoskeletal: Strength & Muscle Tone: within normal limits Gait & Station: normal Patient leans: N/A  Psychiatric Specialty Exam:  Presentation  General Appearance: Appropriate for Environment  Eye Contact:Fair  Speech:Normal Rate  Speech Volume:Decreased  Handedness:Right   Mood and Affect  Mood:Anxious; Depressed  Affect:Congruent; Constricted; Tearful   Thought Process  Thought Processes:Coherent  Descriptions of Associations:Intact  Orientation:Full (Time, Place and Person)  Thought Content:Logical  History of Schizophrenia/Schizoaffective  disorder:No  Duration of Psychotic Symptoms:N/A  Hallucinations:Hallucinations: None  Ideas of Reference:None  Suicidal Thoughts:Suicidal Thoughts: No (last had this morning) SI Active Intent and/or Plan: Without Plan  Homicidal Thoughts:Homicidal Thoughts: No   Sensorium  Memory:Immediate Good; Recent Good; Remote Good  Judgment:Poor  Insight:Poor   Executive Functions  Concentration:Fair  Attention Span:Fair  Recall:Fair  Fund of Knowledge:Fair  Language:Fair   Psychomotor Activity  Psychomotor Activity:No data recorded  Assets  Assets:Communication Skills; Desire for Improvement; Financial Resources/Insurance; Physical Health   Sleep  Sleep:Sleep: Fair    Physical Exam: Physical Exam Vitals and nursing note reviewed.  Cardiovascular:     Rate and Rhythm: Normal rate and regular rhythm.  Musculoskeletal:     Cervical back: Normal range of motion.  Neurological:     Mental Status: She is alert.  Psychiatric:        Mood and Affect: Mood normal.  Thought Content: Thought content normal.   Review of Systems  Eyes: Negative.   Cardiovascular: Negative.   Genitourinary: Negative.   Psychiatric/Behavioral:  Positive for depression. Negative for hallucinations. Suicidal ideas: passive ideations.The patient is nervous/anxious.   All other systems reviewed and are negative. Blood pressure 118/74, pulse 81, temperature 98.1 F (36.7 C), temperature source Oral, resp. rate 18, height 5\' 5"  (1.651 m), weight 90.3 kg, last menstrual period 04/06/2021, SpO2 98 %. Body mass index is 33.12 kg/m.   Treatment Plan Summary: Daily contact with patient to assess and evaluate symptoms and progress in treatment and Medication management  Continue with current treatment plan on 04/11/2021 as listed below except where noted.   Major Depression disorder: Oppositional defiant disorder:  Mood disorder:  Continue Zoloft 100 mg po daily Continue Trazodone 100  mg nightly  Continue Propranolol 10 mg  po twice daily  CSW to continue working on discharge disposition Patient was encouraged to participate within the threptic milieu Consider follow-up with partial hospitalization at discharge (PHP)    06/11/2021, NP 04/11/2021, 2:04 PM

## 2021-04-11 NOTE — Progress Notes (Signed)
D- Patient alert and oriented. Patient affect/mood reported as improving. Denies SI, HI, AVH.  Pain rated as a 5 for shoulder pain, no medication given or requested. Patient Goal:  " opening up to doctors" . ? ?A- Scheduled medications administered to patient, per MD orders. Support and encouragement provided.  Routine safety checks conducted every 15 minutes.  Patient informed to notify staff with problems or concerns. ? ?R- No adverse drug reactions noted. Patient contracts for safety at this time. Patient compliant with medications and treatment plan. Patient receptive, calm, and cooperative. Patient interacts well with others on the unit.  Patient remains safe at this time.  ?

## 2021-04-11 NOTE — Group Note (Signed)
LCSW Group Therapy Note ? ?04/11/2021   10:30-11:30am  ? ?Type of Therapy and Topic:  Group Therapy: Anger Cues and Responses ? ?Participation Level:  Active ? ? ?Description of Group:   ?In this group, patients learned how to recognize the physical, cognitive, emotional, and behavioral responses they have to anger-provoking situations.  They identified a recent time they became angry and how they reacted.  They analyzed how their reaction was possibly beneficial and how it was possibly unhelpful.  The group discussed a variety of healthier coping skills that could help with such a situation in the future.  Focus was placed on how helpful it is to recognize the underlying emotions to our anger, because working on those can lead to a more permanent solution as well as our ability to focus on the important rather than the urgent. ? ?Therapeutic Goals: ?Patients will remember their last incident of anger and how they felt emotionally and physically, what their thoughts were at the time, and how they behaved. ?Patients will identify how their behavior at that time worked for them, as well as how it worked against them. ?Patients will explore possible new behaviors to use in future anger situations. ?Patients will learn that anger itself is normal and cannot be eliminated, and that healthier reactions can assist with resolving conflict rather than worsening situations. ? ?Summary of Patient Progress:  The patient shared that her most recent time of anger was thinking about her family and past trauma and said her way of dealing is to self harm. The group explored possible new behaviors to use in future anger situations. ? ?Therapeutic Modalities:   ?Cognitive Behavioral Therapy ? ?Read Drivers, LCSWA ?04/11/2021  1:27 PM   ? ?

## 2021-04-12 DIAGNOSIS — F332 Major depressive disorder, recurrent severe without psychotic features: Principal | ICD-10-CM

## 2021-04-12 NOTE — Progress Notes (Signed)
?   04/12/21 2309  ?Psych Admission Type (Psych Patients Only)  ?Admission Status Voluntary  ?Psychosocial Assessment  ?Patient Complaints None  ?Eye Contact Brief  ?Facial Expression Anxious  ?Affect Appropriate to circumstance  ?Speech Logical/coherent  ?Interaction Assertive  ?Motor Activity Other (Comment) ?(wdl)  ?Appearance/Hygiene Unremarkable  ?Behavior Characteristics Cooperative  ?Mood Anxious  ?Thought Process  ?Coherency WDL  ?Content WDL  ?Delusions None reported or observed  ?Perception WDL  ?Hallucination None reported or observed  ?Judgment Impaired  ?Confusion None  ?Danger to Self  ?Current suicidal ideation? Denies  ?Self-Injurious Behavior No self-injurious ideation or behavior indicators observed or expressed   ?Agreement Not to Harm Self Yes  ?Description of Agreement verbal contract  ?Danger to Others  ?Danger to Others None reported or observed  ?Danger to Others Abnormal  ?Harmful Behavior to others No threats or harm toward other people  ?Destructive Behavior No threats or harm toward property  ? ?D: Patient in dayroom interacting well with peer. Pt reports she has a good day and is looking forward to discharge.  ?A: Medications administered as prescribed. Support and encouragement provided as needed.  ?R: Patient remains safe on the unit. Will continue to monitor for safety and stability.   ?

## 2021-04-12 NOTE — Group Note (Signed)
?  [facility] LCSW Group Therapy Note ? ?Date/Time:  04/12/2021 10:00AM-11:00AM ? ?Type of Therapy and Topic:  Group Therapy:  Self-Care after Hospitalization ? ?Participation Level:  Active  ? ?Description of Group ?This process group involved patients discussing how they plan to take care of themselves in a better manner when they get home from the hospital.  The group started with patients listing one healthy and one unhealthy way they took care of themselves prior to hospitalization.  A discussion ensued about the differences in healthy and unhealthy coping skills.  Group members shared ideas about making changes when they return home so that they can stay well and in recovery.  The white board was used to list ideas so that patients can continue to see these ideas throughout the day. ? ?Therapeutic Goals ?Patient will identify and describe one healthy and one unhealthy coping technique used prior to hospitalization ?Patient will participate in generating ideas about healthy self-care options when they return to the community ?Patients will be supportive of one another and receive said support from others ?Patient will identify one healthy self-care activity to add to his/her post-hospitalization life that can help in recovery ? ?Summary of Patient Progress:  The patient expressed that prior to hospitalization some healthy self-care activity that she engaged in was walking in the park, while unhealthy self-care activity included hurting herself.  Patient's participation in group was beneficial.    ? ?Therapeutic Modalities ?Brief Solution-Focused Therapy ?Motivational Interviewing ?Psychoeducation ? ? ?   ?Veva Holes, LCSWA ?04/12/2021  5:14 PM   ? ?

## 2021-04-12 NOTE — Progress Notes (Signed)
Pt's affect is appearing to be more bright and pt denied feeling depressed today.  Pt denied SI/HI/AVH.  Pt is interacting with her peers on the unit and appears to be in no acute distress.   ?

## 2021-04-12 NOTE — Progress Notes (Signed)
The focus of this group is to help patients review their daily goal of treatment and discuss progress on daily workbooks.  ? ?Pt attended the evening group and responded to all discussion prompts from the [place]. Pt shared that today was a good day on the unit, the highlight of which was enjoying the fellowship of her peers. "We've laughed a lot. I've made a lot of friends here." ? ?Pt told that her goal for the coming week was to discharge home, which she felt prepared for. ? ?Pt rated her day an 8 out of 10 and her affect was appropriate. ?

## 2021-04-12 NOTE — [facility] Group Notes (Signed)
Adult Psychoeducational Group Not ?Date:  04/12/2021 ?Time:  5621-3086 ?Group Topic/Focus: PROGRESSIVE RELAXATION. [place] group where deep breathing is taught and tensing and relaxation muscle groups is used. Imagery is used as well.  Pts are asked to imagine 3 pillars that hold them up when they are not able to hold themselves up and to share that with the group. ? ?Participation Level:  Active ? ?Participation Quality:  Appropriate ? ?Affect:  Appropriate ? ?Cognitive:  Oriented ? ?Insight: Improving ? ?Engagement in Group:  Engaged ? ?Modes of Intervention:  Activity, Discussion, Education, and Support ? ?Additional Comments:  Rates her energy at [place] 7/10. What holds her up is Love, faith and her dreams. ? ?Alisha Washington ? ? ?

## 2021-04-12 NOTE — Progress Notes (Addendum)
[facility] MD Progress Note ? ?04/12/2021 2:10 PM ?Alisha Washington  ?MRN:  [id number] ? ?Subjective:  " I am really improving and want to know when I will be going home" ? ?Today's Evaluation:   Chart reviewed and care discussed with Members of our interdisciplinary team.  Patient was seen 1:1 in the office.  She presented a brighter affect and reported improvement in her mood from when she came in to the hospital.  Patient reported today better sleep and appetite and says she is ready to go back home.  She reported loneliness  and difficulty making friends and she is not close to her parents and she does not plan to get closer .  She has a job and an an apartment but feels lonely.  Patient is taking Sertraline for depression,Trazodone 100 mg for sleep and Propranolol for anxiety.  Patient denied SI/HI/AVH and no paranoia.  She is for possible discharge this week. ?Principal Problem: Severe recurrent major depression (HCC) ?Diagnosis: Principal Problem: ?  Severe recurrent major depression (HCC) ? ?Total Time spent with patient: 15 minutes ? ?Past Psychiatric History:  ? ?Past Medical History:  ?Past Medical History:  ?Diagnosis Date  ? Deliberate self-cutting   ? Panic attacks   ? Scoliosis   ? Seasonal allergies   ? History reviewed. No pertinent surgical history. ?Family History:  ?Family History  ?Problem Relation Age of Onset  ? Diabetes Father   ? ?Family Psychiatric  History:  ?Social History:  ?Social History  ? ?Substance and Sexual Activity  ?Alcohol Use No  ?   ?Social History  ? ?Substance and Sexual Activity  ?Drug Use Yes  ? Types: Marijuana  ?  ?Social History  ? ?Socioeconomic History  ? Marital status: Single  ?  Spouse name: Not on file  ? Number of children: Not on file  ? Years of education: Not on file  ? Highest education level: Not on file  ?Occupational History  ? Not on file  ?Tobacco Use  ? Smoking status: Never  ? Smokeless tobacco: Never  ?Vaping Use  ? Vaping Use: Every day  ? Substances: Nicotine   ?Substance and Sexual Activity  ? Alcohol use: No  ? Drug use: Yes  ?  Types: Marijuana  ? Sexual activity: Yes  ?  Birth control/protection: Condom  ?Other Topics Concern  ? Not on file  ?Social History Narrative  ? Not on file  ? ?Social Determinants of Health  ? ?Financial Resource Strain: Not on file  ?Food Insecurity: Not on file  ?Transportation Needs: Not on file  ?Physical Activity: Not on file  ?Stress: Not on file  ?Social Connections: Not on file  ? ?Additional Social History:  ?  ?  ?  ?  ?  ? ?Sleep: Good ? ?Appetite:  Good ? ?Current Medications: ?Current Facility-Administered Medications  ?Medication Dose Route Frequency Provider Last Rate Last Admin  ? acetaminophen (TYLENOL) tablet 650 mg  650 mg Oral Q6H PRN Jackelyn Poling, NP      ? alum & mag hydroxide-simeth (MAALOX/MYLANTA) 200-200-20 MG/5ML suspension 30 mL  30 mL Oral Q4H PRN Nira Conn A, NP      ? hydrOXYzine (ATARAX) tablet 25 mg  25 mg Oral TID PRN Jackelyn Poling, NP   25 mg at 04/10/21 2128  ? magnesium hydroxide (MILK OF MAGNESIA) suspension 30 mL  30 mL Oral Daily PRN Jackelyn Poling, NP      ? nicotine (NICODERM CQ -  dosed in mg/24 hours) patch 14 mg  14 mg Transdermal Daily PRN Massengill, Harrold Donath, MD      ? propranolol (INDERAL) tablet 10 mg  10 mg Oral BID Karsten Ro, MD   10 mg at 04/11/21 1637  ? sertraline (ZOLOFT) tablet 100 mg  100 mg Oral Daily Karsten Ro, MD   100 mg at 04/12/21 0751  ? traZODone (DESYREL) tablet 100 mg  100 mg Oral QHS Karsten Ro, MD   100 mg at 04/11/21 2139  ? ? ?Lab Results: No results found for this or any previous visit (from the past 48 hour(s)). ? ?Blood Alcohol level:  ?Lab Results  ?Component Value Date  ? ETH <10 04/06/2021  ? ETH <10 11/20/2016  ? ? ?Metabolic Disorder Labs: ?Lab Results  ?Component Value Date  ? HGBA1C 5.0 04/06/2021  ? MPG 96.8 04/06/2021  ? MPG 105.41 11/21/2016  ? ?No results found for: PROLACTIN ?Lab Results  ?Component Value Date  ? CHOL 155 04/06/2021  ? TRIG 48  04/06/2021  ? HDL 61 04/06/2021  ? CHOLHDL 2.5 04/06/2021  ? VLDL 10 04/06/2021  ? LDLCALC 84 04/06/2021  ? LDLCALC 55 11/21/2016  ? ? ?Physical Findings: ?AIMS: Facial and Oral Movements ?Muscles of Facial Expression: None, normal ?Lips and Perioral Area: None, normal ?Jaw: None, normal ?Tongue: None, normal,Extremity Movements ?Upper (arms, wrists, hands, fingers): None, normal ?Lower (legs, knees, ankles, toes): None, normal, Trunk Movements ?Neck, shoulders, hips: None, normal, Overall Severity ?Severity of abnormal movements (highest score from questions above): None, normal ?Incapacitation due to abnormal movements: None, normal ?Patient's awareness of abnormal movements (rate only patient's report): No Awareness, Dental Status ?Current problems with teeth and/or dentures?: No ?Does patient usually wear dentures?: No  ?CIWA:    ?COWS:    ? ?Musculoskeletal: ?Strength & Muscle Tone: within normal limits ?Gait & Station: normal ?Patient leans: N/A ? ?Psychiatric Specialty Exam: ? ?Presentation  ?General Appearance: Appropriate for Environment; Casual; Neat ? ?Eye Contact:Good ? ?Speech:Clear and Coherent; Normal Rate ? ?Speech Volume:Normal ? ?Handedness:Right ? ? ?Mood and Affect  ?Mood:Euthymic ? ?Affect:Congruent ? ? ?Thought Process  ?Thought Processes:Coherent; Goal Directed; Linear ? ?Descriptions of Associations:Intact ? ?Orientation:Full (Time, Place and Person) ? ?Thought Content:Logical ? ?History of Schizophrenia/Schizoaffective disorder:No ? ?Duration of Psychotic Symptoms:N/A ? ?Hallucinations:Hallucinations: None ? ? ?Ideas of Reference:None ? ?Suicidal Thoughts:Suicidal Thoughts: No ? ? ?Homicidal Thoughts:Homicidal Thoughts: No ? ? ? ?Sensorium  ?Memory:Immediate Good; Recent Good; Remote Good ? ?Judgment:Good ? ?Insight:Good ? ? ?Executive Functions  ?Concentration:Good ? ?Attention Span:Good ? ?Recall:Good ? ?Fund of Knowledge:Good ? ?Language:Good ? ? ?Psychomotor Activity  ?Psychomotor  Activity:Psychomotor Activity: Normal ? ? ?Assets  ?Assets:Communication Skills; Desire for Improvement; Financial Resources/Insurance; Housing; Physical Health ? ? ?Sleep  ?Sleep:Sleep: Good ? ?Physical Exam: ?Physical Exam ?Vitals and nursing note reviewed.  ?Cardiovascular:  ?   Rate and Rhythm: Normal rate and regular rhythm.  ?Musculoskeletal:  ?   Cervical back: Normal range of motion.  ?Neurological:  ?   Mental Status: She is alert.  ?Psychiatric:     ?   Mood and Affect: Mood normal.     ?   Thought Content: Thought content normal.  ? ?Review of Systems  ?Eyes: Negative.   ?Cardiovascular: Negative.   ?Genitourinary: Negative.   ?Psychiatric/Behavioral:  Negative for hallucinations. Suicidal ideas: passive ideations.  ?All other systems reviewed and are negative. ?Blood pressure 102/74, pulse 96, temperature 98 ?F (36.7 ?C), temperature source Oral, resp. rate 18, height 5'  5" (1.651 m), weight 90.3 kg, last menstrual period 04/06/2021, SpO2 99 %. Body mass index is 33.12 kg/m?. ?Treatment Plan Summary: ?Daily contact with patient to assess and evaluate symptoms and progress in treatment and Medication management ?Continue with current treatment plan on 04/12/2021 as listed below except where noted.  ?Major Depression disorder: ?Oppositional defiant disorder:  ?Mood disorder: ? ?Continue Zoloft 100 mg po daily ?Continue Trazodone 100 mg nightly  ?Continue Propranolol 10 mg  po twice daily ? ?CSW to continue working on discharge disposition ?Patient was encouraged to participate within the threptic milieu ?Consider follow-up with partial hospitalization at discharge [facility])  ? ? ?Earney Navy, NP-PMHNP-BC ?04/12/2021, 2:10 PM ? ? ?

## 2021-04-13 ENCOUNTER — Encounter (HOSPITAL_COMMUNITY): Payer: Self-pay

## 2021-04-13 MED ORDER — HYDROXYZINE HCL 25 MG PO TABS: 25.0000 mg | ORAL_TABLET | Freq: Three times a day (TID) | ORAL | 0 refills | Status: DC | PRN

## 2021-04-13 MED ORDER — TRAZODONE HCL 100 MG PO TABS: 100.0000 mg | ORAL_TABLET | Freq: Every day | ORAL | 0 refills | Status: DC

## 2021-04-13 MED ORDER — NICOTINE 14 MG/24HR TD PT24: 14.0000 mg | MEDICATED_PATCH | Freq: Every day | TRANSDERMAL | 0 refills | Status: DC | PRN

## 2021-04-13 MED ORDER — PROPRANOLOL HCL 10 MG PO TABS: 10.0000 mg | ORAL_TABLET | Freq: Two times a day (BID) | ORAL | 0 refills | Status: DC

## 2021-04-13 MED ORDER — SERTRALINE HCL 100 MG PO TABS
100.0000 mg | ORAL_TABLET | Freq: Every day | ORAL | 0 refills | Status: DC
Start: 2021-04-13 — End: 2022-02-18

## 2021-04-13 NOTE — [facility] Group Notes (Signed)
Adult Psychoeducational Group Note ? ?Date:  04/13/2021 ?Time:  9:24 AM ? ?Group Topic/Focus:  ?Goals Group:   The focus of this group is to help patients establish daily goals to achieve during treatment and discuss how the patient can incorporate goal setting into their daily lives to aide in recovery. ? ?Participation Level:  Did Not Attend ? ? ? ?Alisha Washington ?04/13/2021, 9:24 AM ?

## 2021-04-13 NOTE — Progress Notes (Signed)
D: Pt A & O X 4. Denies SI, HI, AVH and pain at this time. Presents in bright affect, pleasant mood with fair eye contact and logical speech. D/C home as ordered. Per pt "I have the bus passes on my phone for transportation home". ?A: D/C instructions reviewed with pt including prescriptions and follow up appointments; compliance encouraged. All belongings from assigned locker returned to pt at time of departure. Scheduled and medications administered with verbal education and effects monitored. Safety checks maintained without incident till time of d/c.  ?R: Pt receptive to care. Compliant with medications when offered. Denies adverse drug reactions when assessed. Verbalized understanding related to d/c instructions. Signed belonging sheet in agreement with items received from locker. Ambulatory with a steady gait. Appears to be in no physical distress at time of departure.  ? ?

## 2021-04-13 NOTE — BH IP Treatment Plan (Signed)
Interdisciplinary Treatment and Diagnostic Plan Update ? ?04/13/2021 ?Time of Session: 9:10am ?Alisha [place] ?MRN: [id number] ? ?Principal Diagnosis: Severe recurrent major depression (HCC) ? ?Secondary Diagnoses: Principal Problem: ?  Severe recurrent major depression (HCC) ? ? ?Current Medications:  ?Current Facility-Administered Medications  ?Medication Dose Route Frequency Provider Last Rate Last Admin  ? acetaminophen (TYLENOL) tablet 650 mg  650 mg Oral Q6H PRN Jackelyn Poling, NP      ? alum & mag hydroxide-simeth (MAALOX/MYLANTA) 200-200-20 MG/5ML suspension 30 mL  30 mL Oral Q4H PRN Jackelyn Poling, NP      ? hydrOXYzine (ATARAX) tablet 25 mg  25 mg Oral TID PRN Jackelyn Poling, NP   25 mg at 04/10/21 2128  ? magnesium hydroxide (MILK OF MAGNESIA) suspension 30 mL  30 mL Oral Daily PRN Nira Conn A, NP      ? nicotine (NICODERM CQ - dosed in mg/24 hours) patch 14 mg  14 mg Transdermal Daily PRN Massengill, Harrold Donath, MD      ? propranolol (INDERAL) tablet 10 mg  10 mg Oral BID Karsten Ro, MD   10 mg at 04/13/21 5027  ? sertraline (ZOLOFT) tablet 100 mg  100 mg Oral Daily Karsten Ro, MD   100 mg at 04/13/21 7412  ? traZODone (DESYREL) tablet 100 mg  100 mg Oral QHS Karsten Ro, MD   100 mg at 04/12/21 2147  ? ?PTA Medications: ?No medications prior to admission.  ? ? ?Patient Stressors: Other: Patient states "none"   ? ?Patient Strengths: General fund of knowledge  ?Physical Health  ?Work skills  ? ?Treatment Modalities: Medication Management, Group therapy, Case management,  ?1 to 1 session with clinician, Psychoeducation, Recreational therapy. ? ? ?Physician Treatment Plan for Primary Diagnosis: Severe recurrent major depression (HCC) ?Long Term Goal(s): Improvement in symptoms so as ready for discharge  ? ?Short Term Goals: Ability to identify changes in lifestyle to reduce recurrence of condition will improve ?Ability to verbalize feelings will improve ?Ability to disclose and discuss suicidal  ideas ?Ability to demonstrate self-control will improve ?Ability to identify and develop effective coping behaviors will improve ?Ability to maintain clinical measurements within normal limits will improve ?Compliance with prescribed medications will improve ?Ability to identify triggers associated with substance abuse/mental health issues will improve ? ?Medication Management: Evaluate patient's response, side effects, and tolerance of medication regimen. ? ?Therapeutic Interventions: 1 to 1 sessions, Unit Group sessions and Medication administration. ? ?Evaluation of Outcomes: Adequate for Discharge ? ?Physician Treatment Plan for Secondary Diagnosis: Principal Problem: ?  Severe recurrent major depression (HCC) ? ?Long Term Goal(s): Improvement in symptoms so as ready for discharge  ? ?Short Term Goals: Ability to identify changes in lifestyle to reduce recurrence of condition will improve ?Ability to verbalize feelings will improve ?Ability to disclose and discuss suicidal ideas ?Ability to demonstrate self-control will improve ?Ability to identify and develop effective coping behaviors will improve ?Ability to maintain clinical measurements within normal limits will improve ?Compliance with prescribed medications will improve ?Ability to identify triggers associated with substance abuse/mental health issues will improve    ? ?Medication Management: Evaluate patient's response, side effects, and tolerance of medication regimen. ? ?Therapeutic Interventions: 1 to 1 sessions, Unit Group sessions and Medication administration. ? ?Evaluation of Outcomes: Adequate for Discharge ? ? ?RN Treatment Plan for Primary Diagnosis: Severe recurrent major depression (HCC) ?Long Term Goal(s): Knowledge of disease and therapeutic regimen to maintain health will improve ? ?Short Term Goals: Ability to remain  free from injury will improve, Ability to verbalize frustration and anger appropriately will improve, Ability to  demonstrate self-control, Ability to identify and develop effective coping behaviors will improve, and Compliance with prescribed medications will improve ? ?Medication Management: RN will administer medications as ordered by provider, will assess and evaluate patient's response and provide education to patient for prescribed medication. RN will report any adverse and/or side effects to prescribing provider. ? ?Therapeutic Interventions: 1 on 1 counseling sessions, Psychoeducation, Medication administration, Evaluate responses to treatment, Monitor vital signs and CBGs as ordered, Perform/monitor CIWA, COWS, AIMS and Fall Risk screenings as ordered, Perform wound care treatments as ordered. ? ?Evaluation of Outcomes: Adequate for Discharge ? ? ?LCSW Treatment Plan for Primary Diagnosis: Severe recurrent major depression (HCC) ?Long Term Goal(s): Safe transition to appropriate next level of care at discharge, Engage patient in therapeutic group addressing interpersonal concerns. ? ?Short Term Goals: Engage patient in aftercare planning with referrals and resources, Increase social support, Increase ability to appropriately verbalize feelings, Increase emotional regulation, Identify triggers associated with mental health/substance abuse issues, and Increase skills for wellness and recovery ? ?Therapeutic Interventions: Assess for all discharge needs, 1 to 1 time with Child psychotherapist, Explore available resources and support systems, Assess for adequacy in community support network, Educate family and significant other(s) on suicide prevention, Complete Psychosocial Assessment, Interpersonal group therapy. ? ?Evaluation of Outcomes: Adequate for Discharge ? ? ?Progress in Treatment: ?Attending groups: Yes. ?Participating in groups: Yes. ?Taking medication as prescribed: Yes. ?Toleration medication: Yes. ?Family/Significant other contact made: No, will contact:  Declined Consents  ?Patient understands diagnosis:  No. ?Discussing patient identified problems/goals with staff: Yes. ?Medical problems stabilized or resolved: Yes. ?Denies suicidal/homicidal ideation: Yes. ?Issues/concerns per patient self-inventory: No. ?  ?  ?New problem(s) identified: No, Describe:  None ?  ?New Short Term/Long Term Goal(s): medication stabilization, elimination of SI thoughts, development of comprehensive mental wellness plan.  ?  ?Patient Goals: "To go home"  ?  ?Discharge Plan or Barriers: Patient is to start PHP through [facility] at discharge ? ? ?Reason for Continuation of Hospitalization:  ?Medication stabilization ? ?Estimated Length of Stay: Adequate for discharge ? ? ?Scribe for Treatment Team: ?Otelia Santee, LCSW ?04/13/2021 ?11:27 AM ?

## 2021-04-13 NOTE — Progress Notes (Signed)
?  [facility] Adult Case Management Discharge Plan : ? ?Will you be returning to the same living situation after discharge:  Yes,  Home  ?At discharge, do you have transportation home?: Yes,  Bus Passes ?Do you have the ability to pay for your medications: Yes,  Insurance ? ?Release of information consent forms completed and in the chart;  Patient's signature needed at discharge. ? ?Patient to Follow up at: ? Follow-up Information   ? ? [facility]. Go on 05/11/2021.   ?Why: You have an appointment to establish care with this provider for primary care services on 05/11/21 at 2:00 pm. This appointment will be held in person.  * You may use their discount pharmacy at any time beforehand.  * New Address:  [address]. ?Contact information: ?[address] ?[place] [address] ?[phone] ? ?  ?  ? ? BEHAVIORAL HEALTH PARTIAL HOSPITALIZATION PROGRAM Follow up on 04/14/2021.   ?Specialty: Behavioral Health ?Why: Referral has been made.  You have an assessment appointment on 04/14/2021 at 9:00am.  This appointment will last approximately one hour and will be Virtual via Webex.  PHP is Virtual group therapy that runs Mon-Fri from 9 am - 1 pm.  Please download the [employer] app prior to the appt.  For questions, call [phone]. ?Contact information: ?[address] ?[place] [address] ?[phone] ? ?  ?  ? ?  ?  ? ?  ? ? ?Next level of care provider has access to [facility] Link:yes ? ?Safety Planning and Suicide Prevention discussed: Yes,  with patient  ? ?  ? ?Has patient been referred to the Quitline?: N/A patient is not a smoker ? ?Patient has been referred for addiction treatment: N/A ? ?Aram Beecham, LCSWA ?04/13/2021, 12:08 PM ?

## 2021-04-13 NOTE — Group Note (Signed)
Recreation Therapy Group Note ? ? ?Group Topic:Stress Management  ?Group Date: 04/13/2021 ?Start Time: 80 ?End Time: 0955 ?Facilitators: Caroll Rancher, LRT,CTRS ?Location: [address] Day[address] ? ? ?Goal Area(s) Addresses:  ?Patient will identify positive stress management techniques. ?Patient will identify benefits of using stress management post d/c. ? ?Group Description:  Meditation.  LRT played a meditation that focused on using your day to restore, forgive and show grace to yourself and others.  The meditation also focused on loving every part of yourself flaws and all.  Patients were to listen and follow along with the meditation as it played to fully engage in activity. ? ? ?Affect/Mood: Appropriate ?  ?Participation Level: Active ?  ?Participation Quality: Independent ?  ?Behavior: Attentive  ?  ?Speech/Thought Process: Focused ?  ?Insight: Good ?  ?Judgement: Good ?  ?Modes of Intervention: Meditation ?  ?Patient Response to Interventions:  Attentive and Engaged ?  ?Education Outcome: ? Acknowledges education and In group clarification offered   ? ?Clinical Observations/Individualized Feedback: Pt attended and participated in group session.  ? ? ?Plan: Continue to engage patient in RT group sessions 2-3x/week. ? ? ?Caroll Rancher, LRT,CTRS ?04/13/2021 1:30 PM ?
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
## 2021-04-13 NOTE — Discharge Summary (Signed)
Physician Discharge Summary Note  Patient:  Alisha Washington is an 26 y.o., female MRN:  [id number] DOB:  [birth year] Patient phone:  [phone] (home)  Patient address:   [address] [place] [address],  Total Time spent with patient: 30 minutes  Date of Admission:  04/06/2021 Date of Discharge: 04/13/21  Reason for Admission:  Patient is a 26 year old female with a past psychiatric history of major depressive disorder, who was admitted to the psychiatric unit from the [facility] for evaluation and treatment of suicidal thoughts with plan to cut herself or burned charcoal to breathe it in.    Principal Problem: Severe recurrent major depression [facility]) Discharge Diagnoses: Principal Problem:   Severe recurrent major depression (HCC)   Past Psychiatric History: Major depressive disorder, diagnosed in 2018 Psychiatric hospitalization: X1 in 2018 Suicide attempt: Denies Patient reports history of self-injurious behavior, last was cutting left arm yesterday.  Patient has history of cutting bilateral upper extremities, left more than right, and bilateral lower extremity Psychiatric medications prior to admission: None Previous psychiatric medications: Effexor, Lexapro, Prozac-patient reports she taken all of these for only a week or so, and never took the medications long enough to determine if they were effective or not.  Patient denies having side effects or other reasons to not take medications.  She reports she simply forgot to continue taking the medication  Past Medical History:  Past Medical History:  Diagnosis Date   Deliberate self-cutting    Panic attacks    Scoliosis    Seasonal allergies    History reviewed. No pertinent surgical history. Family History:  Family History  Problem Relation Age of Onset   Diabetes Father    Family Psychiatric  History: Patient reports mother and father had psychiatric diagnosis but patient is unsure of what the diagnosis is.  Patient reports  history of suicide attempts and uncles, but is unsure further details.   Social History:  Social History   Substance and Sexual Activity  Alcohol Use No     Social History   Substance and Sexual Activity  Drug Use Yes   Types: Marijuana    Social History   Socioeconomic History   Marital status: Single    Spouse name: Not on file   Number of children: Not on file   Years of education: Not on file   Highest education level: Not on file  Occupational History   Not on file  Tobacco Use   Smoking status: Never   Smokeless tobacco: Never  Vaping Use   Vaping Use: Every day   Substances: Nicotine  Substance and Sexual Activity   Alcohol use: No   Drug use: Yes    Types: Marijuana   Sexual activity: Yes    Birth control/protection: Condom  Other Topics Concern   Not on file  Social History Narrative   Not on file   Social Determinants of Health   Financial Resource Strain: Not on file  Food Insecurity: Not on file  Transportation Needs: Not on file  Physical Activity: Not on file  Stress: Not on file  Social Connections: Not on file    Hospital Course:  Patient is a 26 year old female with a past psychiatric history of major depressive disorder, who was admitted to the psychiatric unit from the [facility] for evaluation and treatment of suicidal thoughts with plan to cut herself or burned charcoal to breathe it in.     During the patient's hospitalization, patient had extensive initial psychiatric evaluation, and  follow-up psychiatric evaluations every day.   Psychiatric diagnoses provided upon initial assessment:  -Major depressive disorder, recurrent, severe, without psychotic features -GAD with panic attacks -PTSD   Patient's psychiatric medications were adjusted on admission:  -Increase Zoloft to 50 mg/day, for MDD, GAD, PTSD.   -Continue trazodone 50 mg nightly as needed -Start Nicotine replacement therapy -Start Atarax 25 mg TID as needed for anxiety.     During the hospitalization, other adjustments were made to the patient's psychiatric medication regimen:  Increased Zoloft to 100 mg po daily Increased Trazodone 100 mg nightly  Started Propranolol 10 mg  po twice daily   Patient's care was discussed during the interdisciplinary team meeting every day during the hospitalization.   The patient denies having side effects to prescribed psychiatric medication.   Gradually, patient started adjusting to milieu. The patient was evaluated each day by a clinical provider to ascertain response to treatment. Improvement was noted by the patient's report of decreasing symptoms, improved sleep and appetite, affect, medication tolerance, behavior, and participation in unit programming.  Patient was asked each day to complete a self inventory noting mood, mental status, pain, new symptoms, anxiety and concerns.   Symptoms were reported as significantly decreased or resolved completely by discharge.  The patient reports that their mood is stable.  The patient denied having suicidal thoughts for more than 48 hours prior to discharge.  Patient denies having homicidal thoughts.  Patient denies having auditory hallucinations.  Patient denies any visual hallucinations or other symptoms of psychosis.  The patient was motivated to continue taking medication with a goal of continued improvement in mental health.    The patient reports their target psychiatric symptoms of depression, anxiety, SI responded well to the psychiatric medications, and the patient reports overall benefit other psychiatric hospitalization. Supportive psychotherapy was provided to the patient. The patient also participated in regular group therapy while hospitalized. Coping skills, problem solving as well as relaxation therapies were also part of the unit programming.   Labs were reviewed with the patient, and abnormal results were discussed with the patient.  TSH less than 0.010, T41.25,  thyroperoxidase antibodies 416, thyroglobulin antibodies 28.4, thyroid-stimulating immunoglobulin 0.76, thyrotropin receptor antibodies 2.28.   The patient is able to verbalize their individual safety plan to this provider.   # It is recommended to the patient to continue psychiatric medications as prescribed, after discharge from the hospital.     # It is recommended to the patient to follow up with your outpatient psychiatric provider and PCP. Recommended to f/u with PCP for abnormal thyroid labs.    # It was discussed with the patient, the impact of alcohol, drugs, tobacco have been there overall psychiatric and medical wellbeing, and total abstinence from substance use was recommended the patient.ed.   # Prescriptions provided or sent directly to preferred pharmacy at discharge. Patient agreeable to plan. Given opportunity to ask questions. Appears to feel comfortable with discharge.    # In the event of worsening symptoms, the patient is instructed to call the crisis hotline, 911 and or go to the nearest ED for appropriate evaluation and treatment of symptoms. To follow-up with primary care provider for other medical issues, concerns and or health care needs   # Patient was discharged home with a plan to follow up as noted below.   Physical Findings: AIMS: Facial and Oral Movements Muscles of Facial Expression: None, normal Lips and Perioral Area: None, normal Jaw: None, normal Tongue: None, normal,Extremity Movements Upper (  arms, wrists, hands, fingers): None, normal Lower (legs, knees, ankles, toes): None, normal, Trunk Movements Neck, shoulders, hips: None, normal, Overall Severity Severity of abnormal movements (highest score from questions above): None, normal Incapacitation due to abnormal movements: None, normal Patient's awareness of abnormal movements (rate only patient's report): No Awareness, Dental Status Current problems with teeth and/or dentures?: No Does patient  usually wear dentures?: No  CIWA:    COWS:     Musculoskeletal: Strength & Muscle Tone: within normal limits Gait & Station: normal Patient leans: N/A   Psychiatric Specialty Exam:  Presentation  General Appearance: Appropriate for Environment; Casual; Neat  Eye Contact:Good  Speech:Clear and Coherent; Normal Rate  Speech Volume:Normal  Handedness:Right   Mood and Affect  Mood:Euthymic  Affect:Congruent   Thought Process  Thought Processes:Coherent; Goal Directed  Descriptions of Associations:Intact  Orientation:Full (Time, Place and Person)  Thought Content:Logical  History of Schizophrenia/Schizoaffective disorder:No  Duration of Psychotic Symptoms:N/A  Hallucinations:Hallucinations: None  Ideas of Reference:None  Suicidal Thoughts:Suicidal Thoughts: No  Homicidal Thoughts:Homicidal Thoughts: No   Sensorium  Memory:Immediate Good; Recent Good; Remote Good  Judgment:Good  Insight:Good   Executive Functions  Concentration:Good  Attention Span:Good  Recall:Good  Fund of Knowledge:Good  Language:Good   Psychomotor Activity  Psychomotor Activity:Psychomotor Activity: Normal   Assets  Assets:Communication Skills; Desire for Improvement; Financial Resources/Insurance; Housing; Physical Health   Sleep  Sleep:Sleep: Good    Physical Exam: Physical Exam Vitals and nursing note reviewed.  Constitutional:      General: She is not in acute distress.    Appearance: Normal appearance. She is not ill-appearing, toxic-appearing or diaphoretic.  Pulmonary:     Effort: Pulmonary effort is normal.  Neurological:     General: No focal deficit present.     Mental Status: She is alert and oriented to person, place, and time.   Review of Systems  Constitutional:  Negative for chills and fever.  Respiratory:  Negative for cough and shortness of breath.   Cardiovascular:  Negative for chest pain.  Gastrointestinal:  Negative for abdominal  pain, nausea and vomiting.  Neurological:  Negative for dizziness and headaches.  Psychiatric/Behavioral:  Negative for depression, hallucinations and suicidal ideas. The patient is not nervous/anxious.   Blood pressure 123/82, pulse 82, temperature 98.2 F (36.8 C), temperature source Oral, resp. rate 16, height 5\' 5"  (1.651 m), weight 90.3 kg, last menstrual period 04/06/2021, SpO2 99 %. Body mass index is 33.12 kg/m.   Social History   Tobacco Use  Smoking Status Never  Smokeless Tobacco Never   Tobacco Cessation:  A prescription for an FDA-approved tobacco cessation medication provided at discharge   Blood Alcohol level:  Lab Results  Component Value Date   ETH <10 04/06/2021   ETH <10 11/20/2016    Metabolic Disorder Labs:  Lab Results  Component Value Date   HGBA1C 5.0 04/06/2021   MPG 96.8 04/06/2021   MPG 105.41 11/21/2016   No results found for: PROLACTIN Lab Results  Component Value Date   CHOL 155 04/06/2021   TRIG 48 04/06/2021   HDL 61 04/06/2021   CHOLHDL 2.5 04/06/2021   VLDL 10 04/06/2021   LDLCALC 84 04/06/2021   LDLCALC 55 11/21/2016    See Psychiatric Specialty Exam and Suicide Risk Assessment completed by Attending Physician prior to discharge.  Discharge destination:  Home  Is patient on multiple antipsychotic therapies at discharge:  No   Has Patient had three or more failed trials of antipsychotic monotherapy by history:  No  Recommended Plan for Multiple Antipsychotic Therapies: NA  Discharge Instructions     Diet - low sodium heart healthy   Complete by: As directed    Increase activity slowly   Complete by: As directed       Allergies as of 04/13/2021   No Known Allergies      Medication List     TAKE these medications      Indication  hydrOXYzine 25 MG tablet Commonly known as: ATARAX Take 1 tablet (25 mg total) by mouth 3 (three) times daily as needed for anxiety.  Indication: Feeling Anxious   nicotine 14 mg/24hr  patch Commonly known as: NICODERM CQ - dosed in mg/24 hours Place 1 patch (14 mg total) onto the skin daily as needed (tobacco cessation).  Indication: Nicotine Addiction   propranolol 10 MG tablet Commonly known as: INDERAL Take 1 tablet (10 mg total) by mouth 2 (two) times daily.  Indication: Feeling Anxious   sertraline 100 MG tablet Commonly known as: ZOLOFT Take 1 tablet (100 mg total) by mouth daily.  Indication: Generalized Anxiety Disorder, Major Depressive Disorder   traZODone 100 MG tablet Commonly known as: DESYREL Take 1 tablet (100 mg total) by mouth at bedtime.  Indication: Trouble Sleeping        Follow-up Information     [facility]. Go on 05/11/2021.   Why: You have an appointment to establish care with this provider for primary care services on 05/11/21 at 2:00 pm. This appointment will be held in person.  * You may use their discount pharmacy at any time beforehand.  * New Address:  [address]. Contact information: [address] [place] [id number] [phone]        BEHAVIORAL HEALTH PARTIAL HOSPITALIZATION PROGRAM Follow up on 04/14/2021.   Specialty: Behavioral Health Why: Referral has been made.  You have an assessment appointment on 04/14/2021 at 9:00am.  This appointment will last approximately one hour and will be Virtual via Webex.  PHP is Virtual group therapy that runs Mon-Fri from 9 am - 1 pm.  Please download the [employer] Webex app prior to the appt.  For questions, call [phone]. Contact information: [address] [place] [id number] [phone]                Follow-up recommendations:Activity: as tolerated   Diet: heart healthy   Other: -Follow-up with your outpatient psychiatric provider -instructions on appointment date, time, and address (location) are provided to you in discharge paperwork.   -Take your psychiatric medications as  prescribed at discharge - instructions are provided to you in the discharge paperwork   -Follow-up with outpatient primary care doctor and other specialists -for management of chronic medical disease, including: preventative medicine and abnormal thyroid labs.   -Testing: Follow-up with outpatient provider for abnormal lab results:  TSH less than 0.010, T41.25, thyroperoxidase antibodies 416, thyroglobulin antibodies 28.4, thyroid-stimulating immunoglobulin 0.76, thyrotropin receptor antibodies 2.28.   -Recommend abstinence from alcohol, tobacco, and other illicit drug use at discharge.    -If your psychiatric symptoms recur, worsen, or if you have side effects to your psychiatric medications, call your outpatient psychiatric provider, 911, [phone] or go to the nearest emergency department.   -If suicidal thoughts recur, call your outpatient psychiatric provider, 911, [phone] or go to the nearest emergency department.   Signed: Karsten RoVandana  Beadie Matsunaga, MD 04/13/2021, 5:15 PM

## 2021-04-14 ENCOUNTER — Telehealth (HOSPITAL_COMMUNITY): Payer: Self-pay | Admitting: Licensed Clinical Social Worker

## 2021-04-14 ENCOUNTER — Ambulatory Visit (HOSPITAL_COMMUNITY): Payer: 59

## 2021-04-14 ENCOUNTER — Other Ambulatory Visit (HOSPITAL_COMMUNITY): Payer: 59

## 2021-04-14 NOTE — BHH Group Notes (Signed)
?  Spiritual care group on grief and loss facilitated by chaplain Dyanne Carrel, [facility]  ? ?Group Goal:  ? ?Support / Education around grief and loss  ? ?Members engage in facilitated group support and psycho-social education.  ? ?Group Description:  ? ?Following introductions and group rules, group members engaged in facilitated group dialog and support around topic of loss, with particular support around experiences of loss in their lives. Group Identified types of loss (relationships / self / things) and identified patterns, circumstances, and changes that precipitate losses. Reflected on thoughts / feelings around loss, normalized grief responses, and recognized variety in grief experience. Group noted Worden's four tasks of grief in discussion.  ? ?Group drew on Adlerian / Rogerian, narrative, MI,  ? ?Patient Progress: Alisha Washington attended group and actively engaged in conversation for the time that she was present in group. ? ?[employer], [facility] ?Pager, [phone] ? ?

## 2021-04-15 ENCOUNTER — Ambulatory Visit (HOSPITAL_COMMUNITY): Payer: 59

## 2021-04-15 ENCOUNTER — Other Ambulatory Visit (HOSPITAL_COMMUNITY): Payer: 59

## 2021-04-16 ENCOUNTER — Ambulatory Visit (HOSPITAL_COMMUNITY): Payer: 59

## 2021-04-16 ENCOUNTER — Other Ambulatory Visit (HOSPITAL_COMMUNITY): Payer: 59

## 2021-04-17 ENCOUNTER — Other Ambulatory Visit (HOSPITAL_COMMUNITY): Payer: 59

## 2021-04-17 ENCOUNTER — Ambulatory Visit (HOSPITAL_COMMUNITY): Payer: 59

## 2021-04-20 ENCOUNTER — Other Ambulatory Visit (HOSPITAL_COMMUNITY): Payer: 59

## 2021-04-20 ENCOUNTER — Ambulatory Visit (HOSPITAL_COMMUNITY): Payer: 59

## 2021-04-21 ENCOUNTER — Other Ambulatory Visit (HOSPITAL_COMMUNITY): Payer: 59

## 2021-04-21 ENCOUNTER — Ambulatory Visit (HOSPITAL_COMMUNITY): Payer: 59

## 2021-04-22 ENCOUNTER — Ambulatory Visit (HOSPITAL_COMMUNITY): Payer: 59

## 2021-04-22 ENCOUNTER — Other Ambulatory Visit (HOSPITAL_COMMUNITY): Payer: 59

## 2021-04-23 ENCOUNTER — Ambulatory Visit (HOSPITAL_COMMUNITY): Payer: 59

## 2021-04-23 ENCOUNTER — Other Ambulatory Visit (HOSPITAL_COMMUNITY): Payer: 59

## 2021-04-24 ENCOUNTER — Other Ambulatory Visit (HOSPITAL_COMMUNITY): Payer: 59

## 2021-04-24 ENCOUNTER — Ambulatory Visit (HOSPITAL_COMMUNITY): Payer: 59

## 2021-04-27 ENCOUNTER — Other Ambulatory Visit (HOSPITAL_COMMUNITY): Payer: 59

## 2021-04-27 ENCOUNTER — Ambulatory Visit (HOSPITAL_COMMUNITY): Payer: 59

## 2021-04-28 ENCOUNTER — Other Ambulatory Visit (HOSPITAL_COMMUNITY): Payer: 59

## 2021-04-28 ENCOUNTER — Ambulatory Visit (HOSPITAL_COMMUNITY): Payer: 59

## 2021-04-29 ENCOUNTER — Other Ambulatory Visit (HOSPITAL_COMMUNITY): Payer: 59

## 2021-04-29 ENCOUNTER — Ambulatory Visit (HOSPITAL_COMMUNITY): Payer: 59

## 2021-04-30 ENCOUNTER — Other Ambulatory Visit (HOSPITAL_COMMUNITY): Payer: 59

## 2021-04-30 ENCOUNTER — Ambulatory Visit (HOSPITAL_COMMUNITY): Payer: 59

## 2021-05-01 ENCOUNTER — Ambulatory Visit (HOSPITAL_COMMUNITY): Payer: 59

## 2021-05-01 ENCOUNTER — Other Ambulatory Visit (HOSPITAL_COMMUNITY): Payer: 59

## 2021-05-11 ENCOUNTER — Inpatient Hospital Stay: Payer: Medicaid Other | Admitting: Family Medicine

## 2021-06-01 NOTE — Progress Notes (Signed)
See call intake 

## 2021-06-09 ENCOUNTER — Other Ambulatory Visit: Payer: Self-pay

## 2021-06-09 ENCOUNTER — Ambulatory Visit (HOSPITAL_COMMUNITY)
Admission: EM | Admit: 2021-06-09 | Discharge: 2021-06-09 | Disposition: A | Discharge status: Home / Self Care | Payer: 59 | Attending: Family Medicine | Admitting: Family Medicine

## 2021-06-09 ENCOUNTER — Ambulatory Visit (INDEPENDENT_AMBULATORY_CARE_PROVIDER_SITE_OTHER): Payer: 59

## 2021-06-09 ENCOUNTER — Encounter (HOSPITAL_COMMUNITY): Payer: Self-pay | Admitting: Emergency Medicine

## 2021-06-09 DIAGNOSIS — M25571 Pain in right ankle and joints of right foot: Secondary | ICD-10-CM

## 2021-06-09 DIAGNOSIS — S93491A Sprain of other ligament of right ankle, initial encounter: Secondary | ICD-10-CM

## 2021-06-09 NOTE — Discharge Instructions (Addendum)
If not allergic, you may use over the counter ibuprofen or acetaminophen as needed. ° °

## 2021-06-09 NOTE — ED Triage Notes (Signed)
Patient was stepping off of bus steps to the ground , stepped in hole and ankle felt all "jelly like".  Incident occurred yesterday afternoon, 4:30 pm.  Pain immediately and has continued since stepping in the hole.  Right ankle swollen and painful, slight visible bruising, positive pedal pulse in right foot 2 +, able to slightly move all toes, limited by pain ?

## 2021-06-10 NOTE — ED Provider Notes (Signed)
?[place] ? ? ?[id number] ?06/09/21 Arrival Time: 1252 ? ?ASSESSMENT & PLAN: ? ?1. Acute right ankle pain   ?2. Sprain of anterior talofibular ligament of right ankle, initial encounter   ? ?I have personally viewed the imaging studies ordered this visit. ?No fracture appreciated. ? ?ASO fitted. WBAT. ?OTC analgesics as needed. ? ?Orders Placed This Encounter  ?Procedures  ? DG Ankle Complete Right  ? Apply ASO lace-up ankle brace  ? ? ?Recommend: ? Follow-up Information   ? ? [facility] .   ?Why: If worsening or failing to improve as anticipated. ?Contact information: ?[address] [place] ?[phone] ? ?  ?  ? ?  ?  ? ?  ? ?Reviewed expectations re: course of current medical issues. Questions answered. ?Outlined signs and symptoms indicating need for more acute intervention. ?Patient verbalized understanding. ?After Visit Summary given. ? ?SUBJECTIVE: ?History from: patient. ?Alisha Washington is a 26 y.o. female who reports fairly persistent pain of her right lateral ankle; described as aching; without radiation. ?Onset: abrupt. First noted: yesterday. ?Injury/trama: reports inversion injury; able to bear weight but with pain. ?Symptoms have gradually worsened since beginning. ?Aggravating factors: weight bearing. ?Alleviating factors: have not been identified. ?Associated symptoms: none reported. ?Extremity sensation changes or weakness: none. ?No tx PTA. ?History of similar: no. ? ?History reviewed. No pertinent surgical history.  ? ? ?OBJECTIVE: ? ?Vitals:  ? 06/09/21 1356  ?BP: 111/77  ?Pulse: 78  ?Resp: 18  ?Temp: 98.5 ?F (36.9 ?C)  ?TempSrc: Oral  ?SpO2: 97%  ?  ?General appearance: alert; no distress ?HEENT: [place]; AT ?Neck: supple with FROM ?Resp: unlabored respirations ?Extremities: ?RLE: warm with well perfused appearance; fairly well localized moderate tenderness over right lateral ankle, ATFL distribution; without gross deformities;  swelling: minimal; bruising: none; ankle ROM: limited by reported pain ?CV: brisk extremity capillary refill of RLE; 2+ DP pulse of RLE. ?Skin: warm and dry; no visible rashes ?Neurologic: normal sensation and strength of RLE ?Psychological: alert and cooperative; normal mood and affect ? ?Imaging: ?DG Ankle Complete Right ? ?Result Date: 06/09/2021 ?CLINICAL DATA:  Lateral ankle pain status post inversion injury. EXAM: RIGHT ANKLE - COMPLETE 3+ VIEW COMPARISON:  None Available. FINDINGS: Mild soft tissue swelling overlying the lateral malleolus. No fracture or dislocation. IMPRESSION: Mild soft tissue swelling overlying the lateral malleolus without underlying acute fracture or dislocation. If the patient has continued symptoms, repeat radiographs in 10-14 days. Electronically Signed   By: Miachel Roux M.D.   On: 06/09/2021 14:18   ? ? ? ?No Known Allergies ? ?Past Medical History:  ?Diagnosis Date  ? Deliberate self-cutting   ? Panic attacks   ? Scoliosis   ? Seasonal allergies   ? ?Social History  ? ?Socioeconomic History  ? Marital status: Single  ?  Spouse name: Not on file  ? Number of children: Not on file  ? Years of education: Not on file  ? Highest education level: Not on file  ?Occupational History  ? Not on file  ?Tobacco Use  ? Smoking status: Never  ? Smokeless tobacco: Never  ?Vaping Use  ? Vaping Use: Every day  ? Substances: Nicotine  ?Substance and Sexual Activity  ? Alcohol use: No  ? Drug use: Not Currently  ?  Types: Marijuana  ? Sexual activity: Yes  ?  Birth control/protection: Condom  ?Other Topics Concern  ? Not on file  ?Social History Narrative  ?  Not on file  ? ?Social Determinants of Health  ? ?Financial Resource Strain: Not on file  ?Food Insecurity: Not on file  ?Transportation Needs: Not on file  ?Physical Activity: Not on file  ?Stress: Not on file  ?Social Connections: Not on file  ? ?Family History  ?Problem Relation Age of Onset  ? Diabetes Father   ? ?History reviewed. No pertinent  surgical history. ? ? ?  ?Vanessa Kick, MD ?06/10/21 1124 ? ?

## 2021-06-29 NOTE — Telephone Encounter (Signed)
See call intake 

## 2021-11-26 ENCOUNTER — Telehealth (HOSPITAL_COMMUNITY): Payer: Self-pay | Admitting: Licensed Clinical Social Worker

## 2021-12-02 ENCOUNTER — Telehealth (HOSPITAL_COMMUNITY): Payer: Self-pay | Admitting: Professional

## 2021-12-02 ENCOUNTER — Other Ambulatory Visit (HOSPITAL_COMMUNITY): Payer: 59 | Admitting: Licensed Clinical Social Worker

## 2021-12-02 ENCOUNTER — Telehealth (HOSPITAL_COMMUNITY): Payer: Self-pay | Admitting: Licensed Clinical Social Worker

## 2021-12-02 DIAGNOSIS — F0630 Mood disorder due to known physiological condition, unspecified: Secondary | ICD-10-CM

## 2021-12-02 NOTE — Telephone Encounter (Signed)
Cln sent pt the following email: Orie Fisherman. If you're having some tech issues we can complete the assessment over the phone or in person. If feeling anxious or having second thoughts or anything like that, let's talk about it. I don't have a busy day today, so we can talk through whatever is going on. Please just confirm that you are safe.

## 2021-12-02 NOTE — Progress Notes (Signed)
Pt signed onto Webex for PHP CCA. Pt initially had trouble connecting to video but was able to after a couple of minutes. Cln informed her that a full history would not be required due to this cln completing PHP CCA in February 2023. Cln asked pt what symptoms she is currently experiencing, to which pt responded, "I never know what to say when people ask me that. I don't know what you mean by symptoms." Pt then pointed the camera away from her face and cln stated "Symptoms of anxiety, depression, things like that." Pt then left Webex and did not return. Cln remained on Webex until approximately 10:20 am. Cln made multiple attempts to contact pt by phone and email and called [employer] non emergency line to request welfare check at 10:36 am due to safety concerns from previous CCA and pt becoming suddenly unreachable. Cln canceled welfare check after pt left vm confirming safety and declining to reschedule CCA. Cln emailed pt contact info for [facility], [facility], and [facility] should pt decide to seek outpatient mental health services.

## 2022-02-18 ENCOUNTER — Other Ambulatory Visit: Payer: Self-pay

## 2022-02-18 ENCOUNTER — Encounter (HOSPITAL_COMMUNITY): Payer: Self-pay | Admitting: Psychiatry

## 2022-02-18 ENCOUNTER — Ambulatory Visit (HOSPITAL_COMMUNITY)
Admission: EM | Admit: 2022-02-18 | Discharge: 2022-02-18 | Disposition: A | Discharge status: Other Acute Inpatient Hospital | Payer: 59 | Attending: Psychiatry | Admitting: Psychiatry

## 2022-02-18 ENCOUNTER — Inpatient Hospital Stay (HOSPITAL_COMMUNITY)
Admission: AD | Admit: 2022-02-18 | Discharge: 2022-02-23 | DRG: 885 | Disposition: A | Discharge status: Home / Self Care | Payer: 59 | Source: Intra-hospital | Attending: Psychiatry | Admitting: Psychiatry

## 2022-02-18 DIAGNOSIS — R45851 Suicidal ideations: Secondary | ICD-10-CM | POA: Diagnosis not present

## 2022-02-18 DIAGNOSIS — F322 Major depressive disorder, single episode, severe without psychotic features: Secondary | ICD-10-CM | POA: Diagnosis not present

## 2022-02-18 DIAGNOSIS — F411 Generalized anxiety disorder: Secondary | ICD-10-CM | POA: Diagnosis not present

## 2022-02-18 DIAGNOSIS — F1210 Cannabis abuse, uncomplicated: Secondary | ICD-10-CM | POA: Diagnosis present

## 2022-02-18 DIAGNOSIS — R69 Illness, unspecified: Secondary | ICD-10-CM | POA: Diagnosis not present

## 2022-02-18 DIAGNOSIS — F332 Major depressive disorder, recurrent severe without psychotic features: Secondary | ICD-10-CM | POA: Diagnosis not present

## 2022-02-18 DIAGNOSIS — Z1152 Encounter for screening for COVID-19: Secondary | ICD-10-CM | POA: Diagnosis not present

## 2022-02-18 DIAGNOSIS — F1010 Alcohol abuse, uncomplicated: Secondary | ICD-10-CM | POA: Insufficient documentation

## 2022-02-18 DIAGNOSIS — F4310 Post-traumatic stress disorder, unspecified: Secondary | ICD-10-CM | POA: Diagnosis present

## 2022-02-18 DIAGNOSIS — F314 Bipolar disorder, current episode depressed, severe, without psychotic features: Principal | ICD-10-CM | POA: Diagnosis present

## 2022-02-18 DIAGNOSIS — Z20822 Contact with and (suspected) exposure to covid-19: Secondary | ICD-10-CM | POA: Diagnosis present

## 2022-02-18 DIAGNOSIS — F17290 Nicotine dependence, other tobacco product, uncomplicated: Secondary | ICD-10-CM | POA: Diagnosis present

## 2022-02-18 DIAGNOSIS — Z9152 Personal history of nonsuicidal self-harm: Secondary | ICD-10-CM | POA: Diagnosis not present

## 2022-02-18 LAB — COMPREHENSIVE METABOLIC PANEL
ALT: 16 U/L (ref 0–44)
AST: 19 U/L (ref 15–41)
Albumin: 4.3 g/dL (ref 3.5–5.0)
Alkaline Phosphatase: 67 U/L (ref 38–126)
Anion gap: 11 (ref 5–15)
BUN: 5 mg/dL — ABNORMAL LOW (ref 6–20)
CO2: 19 mmol/L — ABNORMAL LOW (ref 22–32)
Calcium: 9.2 mg/dL (ref 8.9–10.3)
Chloride: 107 mmol/L (ref 98–111)
Creatinine, Ser: 0.59 mg/dL (ref 0.44–1.00)
GFR, Estimated: >60 mL/min (ref >60)
Glucose, Bld: 87 mg/dL (ref 70–99)
Potassium: 3.4 mmol/L — ABNORMAL LOW (ref 3.5–5.1)
Sodium: 137 mmol/L (ref 135–145)
Total Bilirubin: 1.4 mg/dL — ABNORMAL HIGH (ref 0.3–1.2)
Total Protein: 7.5 g/dL (ref 6.5–8.1)

## 2022-02-18 LAB — POCT URINE DRUG SCREEN - MANUAL ENTRY (I-SCREEN)
POC Amphetamine UR: NOT DETECTED
POC Buprenorphine (BUP): NOT DETECTED
POC Cocaine UR: NOT DETECTED
POC Marijuana UR: POSITIVE — AB
POC Methadone UR: NOT DETECTED
POC Methamphetamine UR: NOT DETECTED
POC Morphine: NOT DETECTED
POC Oxazepam (BZO): NOT DETECTED
POC Oxycodone UR: NOT DETECTED
POC Secobarbital (BAR): NOT DETECTED

## 2022-02-18 LAB — LIPID PANEL
Cholesterol: 151 mg/dL (ref 0–200)
HDL: 57 mg/dL (ref >40)
LDL Cholesterol: 75 mg/dL (ref 0–99)
Total CHOL/HDL Ratio: 2.6 RATIO
Triglycerides: 93 mg/dL (ref <150)
VLDL: 19 mg/dL (ref 0–40)

## 2022-02-18 LAB — RESP PANEL BY RT-PCR (RSV, FLU A&B, COVID)  RVPGX2
Influenza A by PCR: NEGATIVE
Influenza B by PCR: NEGATIVE
Resp Syncytial Virus by PCR: NEGATIVE
SARS Coronavirus 2 by RT PCR: NEGATIVE

## 2022-02-18 LAB — POC URINE PREG, ED: Preg Test, Ur: NEGATIVE

## 2022-02-18 LAB — TSH: TSH: 2.495 u[IU]/mL (ref 0.350–4.500)

## 2022-02-18 MED ORDER — ALUM & MAG HYDROXIDE-SIMETH 200-200-20 MG/5ML PO SUSP: 30.0000 mL | ORAL | Status: DC | PRN

## 2022-02-18 MED ORDER — SERTRALINE HCL 50 MG PO TABS
50.0000 mg | ORAL_TABLET | Freq: Every day | ORAL | Status: DC
  Administered 2022-02-18: 50 mg via ORAL
  Filled 2022-02-18: qty 1

## 2022-02-18 MED ORDER — NICOTINE 14 MG/24HR TD PT24: 14.0000 mg | MEDICATED_PATCH | Freq: Every day | TRANSDERMAL | Status: DC | PRN

## 2022-02-18 MED ORDER — ACETAMINOPHEN 325 MG PO TABS: 650.0000 mg | ORAL_TABLET | Freq: Four times a day (QID) | ORAL | Status: DC | PRN

## 2022-02-18 MED ORDER — ACETAMINOPHEN 325 MG PO TABS
650.0000 mg | ORAL_TABLET | Freq: Four times a day (QID) | ORAL | Status: DC | PRN
  Administered 2022-02-20: 650 mg via ORAL
  Filled 2022-02-18: qty 2

## 2022-02-18 MED ORDER — MAGNESIUM HYDROXIDE 400 MG/5ML PO SUSP: 30.0000 mL | Freq: Every day | ORAL | Status: DC | PRN

## 2022-02-18 MED ORDER — HYDROXYZINE HCL 25 MG PO TABS: 25.0000 mg | ORAL_TABLET | Freq: Three times a day (TID) | ORAL | Status: DC | PRN

## 2022-02-18 NOTE — ED Notes (Signed)
Pt is in the bed resting. Respirations are even and unlabored. No acute distress noted. Will continue to monitor for safety 

## 2022-02-18 NOTE — Progress Notes (Signed)
Pt was accepted to [facility] 02/18/2022, pending negative covid and signed voluntary consent faxed to [phone]  Pt meets inpatient criteria per Alver Sorrow, NP  Attending Physician will be Ambrose Finland, MD   Report can be called to: Adult unit [phone]  Care Team Notified: [facility] Clayborne Dana, RN, and Alver Sorrow, NP  Denna Haggard, [place]  02/18/2022 2:15 PM

## 2022-02-18 NOTE — Plan of Care (Signed)
  Problem: Education: Goal: Emotional status will improve Outcome: Not Progressing Goal: Mental status will improve Outcome: Not Progressing   Problem: Coping: Goal: Coping ability will improve Outcome: Not Progressing   
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
## 2022-02-18 NOTE — ED Notes (Signed)
Pt accepted to [facility]. Room 400-1. Attending Dr. Caswell Corwin. Diagnosis MDD. Room ready at 2100. Please call report to [phone]

## 2022-02-18 NOTE — Tx Team (Addendum)
Initial Treatment Plan 02/18/2022 11:04 PM [name] [id number]    PATIENT STRESSORS: Financial difficulties   Marital or family conflict     PATIENT STRENGTHS: Average or above average intelligence  Capable of independent living  General fund of knowledge  Motivation for treatment/growth    PATIENT IDENTIFIED PROBLEMS: "Mostly everything."  Family  Financial concerns                 DISCHARGE CRITERIA:  Improved stabilization in mood, thinking, and/or behavior Reduction of life-threatening or endangering symptoms to within safe limits Verbal commitment to aftercare and medication compliance  PRELIMINARY DISCHARGE PLAN: Attend aftercare/continuing care group Outpatient therapy  PATIENT/FAMILY INVOLVEMENT: This treatment plan has been presented to and reviewed with the patient, Sheyli Horwitz.  The patient has been given the opportunity to ask questions and make suggestions.  Lance Bosch, RN 02/18/2022, 11:04 PM

## 2022-02-18 NOTE — [facility] Notes (Signed)
Patient ID: Alisha Washington, female   DOB: May 16, 1995, 27 y.o.   MRN: [id number] D- Patient alert and oriented.  Denies SI, HI, AVH, and pain. Patient presents fidgety and with an anxious mood. Patient states that she originally went to [facility] to talk to someone about the SI she was experiencing stating that her stressors were "mostly everything." "Life just hasn't been good for awhile." Patient states that she last engaged in cutting July of this year. She endorsed using acid, mushrooms, and marijuana within the past month. Patient states that she was prescribed medication after her last admission, but that she never started taking them. Patient would like to work on staying safe and getting her medications managed while here,  A- Support and encouragement provided. Patient oriented to unit and educated on rules and procedures. Patient educated on self harm coping mechanisms. Routine safety checks conducted every 15 minutes.  Patient informed to notify staff with problems or concerns.  R- Patient contracts for safety at this time. Patient receptive, calm, and cooperative. Patient verbalizes understanding of education.  Patient remains safe at this time.

## 2022-02-18 NOTE — ED Notes (Signed)
Patient A&Ox4. Patient presently denies SI/HI and AVH. However patient reports that she has has SI prior to arrival to unit of over dosing on medication. Patient reports increase depression. Patient denies any physical complaints when asked. No acute distress noted. Support and encouragement provided. Routine safety checks conducted according to facility protocol. Encouraged patient to notify staff if thoughts of harm toward self or others arise. Patient verbalize understanding and agreement. Patient oriented to unit and decline meal. Will continue to monitor for safety.

## 2022-02-18 NOTE — ED Notes (Signed)
Patient resting quietly in bed.  Respirations equal and unlabored, skin warm and dry, NAD. Routine safety checks conducted according to facility protocol. Will continue to monitor for safety.  

## 2022-02-18 NOTE — ED Provider Notes (Signed)
[facility] Urgent Care Continuous Assessment Admission H&P  Date: 02/18/22 Patient Name: Alisha Washington MRN: [id number] Chief Complaint: "I'm having suicidal thoughts with a plan to take all my medications in my closet".  Chief Complaint  Patient presents with   Depression      Diagnoses:  Final diagnoses:  Suicidal ideation  Current severe episode of major depressive disorder without psychotic features, unspecified whether recurrent ([place])  Alcohol abuse    HPI: Alisha Washington is a 27 year old female who presented voluntarily to [facility] as a walk-in wanting to speak to someone about having suicidal thoughts.   Patient was seen face to face by this provider and chart reviewed.   Per chart review, Patient was admitted at [facility] between 04/06/21-04/13/2021 due to Suicidal ideation with plan to cut herself or inhale charcoal.    On evaluation patient is alert and oriented x4, speech is clear. Patient's eye contact is good, mood is anxious, affect is appropriate. Patient's thought process is goal directed and thought content is within normal limits. Patient reported that she has suicidal thoughts with plan to take all the medications in her closet. Patient denies HI, and AVH, or paranoia. There is no indication that the patient is responding to internal stimuli and no delusion noted.    During assessment, patient reported that she came here because she would like to speak to someone due to having suicidal ideation with a plan to take all of her medications in the closet. Patient was unable to provide any stressor. Patient reported that she was admitted here last year February and was prescribed Zoloft and hydroxyzine but patient reported that she has not been taking it. Patient says that she is worried about taking medications because one of her friends had an enlarged heart due to taking different medications and he had been complaining to the provider but they did nothing, so he died after some time and this made  her scared so she stopped taking her medication. Patient said she saw a psychiatrist in November who diagnosed her with bipolar and prescribed Abilify 5 mg, got increased after a week to 10mg . Patient says she started feeling eye twitching and she stopped taking it. Patient said she only took Abilify for 1 week in November. Patient added that she will need to get back on her medication. Patient reported using Molly, acid and mushroom, and the last time she used them was on new year's day.  Patient denies using alcohol currently she said the last time she used alcohol was October. Patient reported that she has 2 room mates and she works as a Chartered certified accountant in a hotel in [place]. Patient reported that her sleep and appetite is fair.   Support, encouragement and reassurance provided about ongoing stressors and patient provided with opportunity for questions.     Recommends inpatient psychiatric admission, patient is unable to contract for safety, patient is in danger to self.    PHQ 2-9:  Flowsheet Row Counselor from 04/06/2021 in [place]  Thoughts that you would be better off dead, or of hurting yourself in some way Nearly every day  PHQ-9 Total Score 22       [place] [facility] from 02/18/2022 in [facility] ED from 06/09/2021 in [facility] at [facility] Admission (Discharged) from 04/06/2021 in [place] [facility]  C-SSRS RISK CATEGORY No Risk No Risk High Risk        Total Time spent with  patient: 30 minutes  Musculoskeletal  Strength & Muscle Tone: within normal limits Gait & Station: normal Patient leans: N/A  Psychiatric Specialty Exam  Presentation General Appearance:  Appropriate for Environment  Eye Contact: Fair  Speech: Normal Rate  Speech Volume: Normal  Handedness: Right   Mood and Affect  Mood: Anxious  Affect: Appropriate   Thought Process   Thought Processes: Coherent  Descriptions of Associations:Intact  Orientation:Full (Time, Place and Person)  Thought Content:WDL  Diagnosis of Schizophrenia or Schizoaffective disorder in past: No   Hallucinations:Hallucinations: None  Ideas of Reference:None  Suicidal Thoughts:Suicidal Thoughts: Yes, Active SI Active Intent and/or Plan: With Plan  Homicidal Thoughts:Homicidal Thoughts: No   Sensorium  Memory: Immediate Good; Recent Good; Remote Good  Judgment: Good  Insight: Good   Executive Functions  Concentration: Good  Attention Span: Good  Recall: Good  Fund of Knowledge: Good  Language: Good   Psychomotor Activity  Psychomotor Activity: Psychomotor Activity: Normal   Assets  Assets: Communication Skills; Physical Health; Resilience; Housing; Transportation   Sleep  Sleep: Sleep: Fair   Nutritional Assessment (For OBS and FBC admissions only) Has the patient had a weight loss or gain of 10 pounds or more in the last 3 months?: No Has the patient had a decrease in food intake/or appetite?: No Does the patient have dental problems?: No Does the patient have eating habits or behaviors that may be indicators of an eating disorder including binging or inducing vomiting?: No Has the patient recently lost weight without trying?: 0 Has the patient been eating poorly because of a decreased appetite?: 0 Malnutrition Screening Tool Score: 0    Physical Exam Vitals reviewed.  Constitutional:      Appearance: Normal appearance.  Eyes:     General:        Right eye: No discharge.        Left eye: No discharge.  Cardiovascular:     Rate and Rhythm: Normal rate.  Pulmonary:     Effort: No respiratory distress.     Breath sounds: No wheezing.  Neurological:     Mental Status: She is alert.     Motor: No weakness.  Psychiatric:        Attention and Perception: Attention normal.        Mood and Affect: Mood is anxious.        Speech:  Speech normal.        Behavior: Behavior normal.        Thought Content: Thought content includes suicidal ideation. Thought content includes suicidal plan.   Review of Systems  Constitutional:  Negative for fever and weight loss.  HENT:  Negative for ear discharge.   Eyes:  Negative for discharge.  Respiratory:  Negative for cough, shortness of breath and wheezing.   Cardiovascular:  Negative for chest pain.  Gastrointestinal:  Negative for abdominal pain, nausea and vomiting.  Neurological:  Negative for dizziness, tremors, seizures, weakness and headaches.  Psychiatric/Behavioral:  Positive for suicidal ideas.     Blood pressure (!) 143/90, pulse 100, temperature 98.4 F (36.9 C), temperature source Oral, resp. rate 18, SpO2 100 %. There is no height or weight on file to calculate BMI.  Past Psychiatric History: See H&P   Is the patient at risk to self? Yes  Has the patient been a risk to self in the past 6 months? Yes .    Has the patient been a risk to self within the distant past? Yes  Is the patient a risk to others? No   Has the patient been a risk to others in the past 6 months? No   Has the patient been a risk to others within the distant past? No   Past Medical History:  Past Medical History:  Diagnosis Date   Deliberate self-cutting    Panic attacks    Scoliosis    Seasonal allergies    No past surgical history on file.  Family History:  Family History  Problem Relation Age of Onset   Diabetes Father     Social History:  Social History   Socioeconomic History   Marital status: Single    Spouse name: Not on file   Number of children: Not on file   Years of education: Not on file   Highest education level: Not on file  Occupational History   Not on file  Tobacco Use   Smoking status: Never   Smokeless tobacco: Never  Vaping Use   Vaping Use: Every day   Substances: Nicotine  Substance and Sexual Activity   Alcohol use: No   Drug use: Not  Currently    Types: Marijuana   Sexual activity: Yes    Birth control/protection: Condom  Other Topics Concern   Not on file  Social History Narrative   Not on file   Social Determinants of Health   Financial Resource Strain: Not on file  Food Insecurity: Not on file  Transportation Needs: Not on file  Physical Activity: Not on file  Stress: Not on file  Social Connections: Not on file  Intimate Partner Violence: Not on file    SDOH:  SDOH Screenings   Alcohol Screen: Low Risk  (04/06/2021)  Depression (PHQ2-9): High Risk (04/06/2021)  Tobacco Use: Low Risk  (06/09/2021)    Last Labs:  No visits with results within 6 Month(s) from this visit.  Latest known visit with results is:  Admission on 04/06/2021, Discharged on 04/13/2021  Component Date Value Ref Range Status   Free T4 04/07/2021 1.25 (H)  0.61 - 1.12 ng/dL Final   Comment: (NOTE) Biotin ingestion may interfere with free T4 tests. If the results are inconsistent with the TSH level, previous test results, or the clinical presentation, then consider biotin interference. If needed, order repeat testing after stopping biotin. Performed at [facility], [place] [address] [place], [place] [address]    TSH 04/07/2021 <0.010 (L)  0.350 - 4.500 uIU/mL Final   Comment: Performed by a 3rd Generation assay with a functional sensitivity of <=0.01 uIU/mL. Performed at [facility], [place] [address], [place], [place] [address]    T3, Total 04/07/2021 161  71 - 180 ng/dL Final   Comment: (NOTE) Performed At: [facility] [place], [place] [id number] Rush Farmer MD [id number]    Thyroperoxidase Ab SerPl-aCnc 04/08/2021 416 (H)  0 - 34 IU/mL Final   Thyroglobulin Antibody 04/08/2021 28.4 (H)  0.0 - 0.9 IU/mL Final   Comment: (NOTE) Thyroglobulin Antibody measured by [facility] Methodology Performed At: [facility] [place], [place]  [id number] Rush Farmer MD WV:[id number]    Thyroid Stimulating Immunoglob 04/08/2021 0.76 (H)  0.00 - 0.55 IU/L Final   Comment: (NOTE) Performed At: [facility] [place], [place] [id number] Rush Farmer MD [id number]    Thyrotropin Receptor Ab 04/08/2021 2.28 (H)  0.00 - 1.75 IU/L Final   Comment: (NOTE) Performed At: [facility] [address]  [place], [place] [id number] Jolene Schimke MD WU:[id number]     Allergies: Patient has no known allergies.  PTA Medications:  Prior to Admission medications   Not on File     Medical Decision Making  Recommend admission to Inpatient Psychiatry for stabilization and treatment. Patient currently admitted to the continuous observation unit for safety monitoring pending inpatient psychiatric hospitalization.    Lab Orders         Resp panel by RT-PCR (RSV, Flu A&B, Covid) Anterior Nasal Swab         CBC with Differential/Platelet         Comprehensive metabolic panel         Hemoglobin A1c         TSH         Lipid panel         POC urine preg, ED         POCT Urine Drug Screen - (I-Screen)      Home medication restarted this encounter - Zoloft 50mg  PO once daily for depression - Hydroxyzine 25mg  PO as needed TID for anxiety  PRN:  Tylenol PO 650mg  q6hr PRN pain MAALOX 75ml PO q4hr PRN for indigestion MOM 45ml PO daily PRN for constipation Nicotine patch 14mg  for smoking craving    Recommendations  Based on my evaluation the patient does not appear to have an emergency medical condition.  Recommends admission to Inpatient Psychiatry for stabilization and treatment. Patient currently admitted to the continuous observation unit for safety monitoring pending inpatient psychiatric hospitalization.      , NP 02/18/22  1:13 PM

## 2022-02-18 NOTE — ED Triage Notes (Signed)
Pt presents to [facility] seeking therapy services due to ongoing depression symptoms. Pt reports passive SI , pt denies any intent or plan to harm herself at this time. Pt reports that she lives with her 3 roommates. Pt denies HI and AVH at this time.

## 2022-02-19 ENCOUNTER — Encounter (HOSPITAL_COMMUNITY): Payer: Self-pay

## 2022-02-19 DIAGNOSIS — F1210 Cannabis abuse, uncomplicated: Secondary | ICD-10-CM | POA: Insufficient documentation

## 2022-02-19 DIAGNOSIS — F411 Generalized anxiety disorder: Secondary | ICD-10-CM | POA: Insufficient documentation

## 2022-02-19 DIAGNOSIS — F314 Bipolar disorder, current episode depressed, severe, without psychotic features: Principal | ICD-10-CM | POA: Insufficient documentation

## 2022-02-19 DIAGNOSIS — F4310 Post-traumatic stress disorder, unspecified: Secondary | ICD-10-CM | POA: Insufficient documentation

## 2022-02-19 MED ORDER — HYDROXYZINE HCL 25 MG PO TABS
25.0000 mg | ORAL_TABLET | Freq: Three times a day (TID) | ORAL | Status: DC | PRN
  Filled 2022-02-19: qty 1

## 2022-02-19 MED ORDER — LURASIDONE HCL 20 MG PO TABS
20.0000 mg | ORAL_TABLET | Freq: Every day | ORAL | Status: DC
  Administered 2022-02-19: 20 mg via ORAL
  Filled 2022-02-19 (×4): qty 1

## 2022-02-19 MED ORDER — NICOTINE POLACRILEX 2 MG MT GUM
2.0000 mg | CHEWING_GUM | OROMUCOSAL | Status: DC | PRN
  Filled 2022-02-19: qty 1

## 2022-02-19 MED ORDER — TRAZODONE HCL 50 MG PO TABS
50.0000 mg | ORAL_TABLET | Freq: Every evening | ORAL | Status: DC | PRN
  Filled 2022-02-19: qty 1

## 2022-02-19 NOTE — Progress Notes (Signed)
   02/19/22 2353  Psych Admission Type (Psych Patients Only)  Admission Status Voluntary  Psychosocial Assessment  Patient Complaints None  Eye Contact Fair  Facial Expression Flat  Affect Appropriate to circumstance  Speech Logical/coherent  Interaction Assertive  Motor Activity Other (Comment) (WDL)  Appearance/Hygiene Unremarkable  Behavior Characteristics Appropriate to situation  Mood Pleasant  Thought Process  Coherency WDL  Content WDL  Delusions None reported or observed  Perception WDL  Hallucination None reported or observed  Judgment Poor  Confusion None  Danger to Self  Current suicidal ideation? Denies  Agreement Not to Harm Self Yes  Description of Agreement verbal  Danger to Others  Danger to Others None reported or observed
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
## 2022-02-19 NOTE — Progress Notes (Signed)
   02/19/22 6438  15 Minute Checks  Location Hallway  Visual Appearance Calm  Behavior Composed  Sleep (Behavioral Health Patients Only)  Calculate sleep? (Click Yes once per 24 hr at 0600 safety check) Yes  Documented sleep last 24 hours 8

## 2022-02-19 NOTE — BHH Group Notes (Signed)
Adult Psychoeducational Group Note  Date:  02/19/2022 Time:  10:07 AM  Group Topic/Focus:  Goals Group:   The focus of this group is to help patients establish daily goals to achieve during treatment and discuss how the patient can incorporate goal setting into their daily lives to aide in recovery.  Participation Level:  Active  Participation Quality:  Appropriate  Affect:  Appropriate  Cognitive:  Appropriate  Insight: Appropriate  Engagement in Group:  Engaged  Modes of Intervention:  Discussion  Additional Comments:  Patient goal of the day is to meet with the doctor and talk about her discharge plan and medications and also therapy recommendations.   Rianna Lukes R Trejan Buda 02/19/2022, 10:07 AM

## 2022-02-19 NOTE — Plan of Care (Signed)
  Problem: Education: Goal: Knowledge of [facility] Education information/materials will improve Outcome: Progressing Goal: Emotional status will improve Outcome: Progressing Goal: Mental status will improve Outcome: Progressing Goal: Verbalization of understanding the information provided will improve Outcome: Progressing   Problem: Activity: Goal: Interest or engagement in activities will improve Outcome: Progressing Goal: Sleeping patterns will improve Outcome: Progressing   Problem: Coping: Goal: Ability to verbalize frustrations and anger appropriately will improve Outcome: Progressing Goal: Ability to demonstrate self-control will improve Outcome: Progressing   Problem: Health Behavior/Discharge Planning: Goal: Identification of resources available to assist in meeting health care needs will improve Outcome: Progressing Goal: Compliance with treatment plan for underlying cause of condition will improve Outcome: Progressing   Problem: Physical Regulation: Goal: Ability to maintain clinical measurements within normal limits will improve Outcome: Progressing   Problem: Safety: Goal: Periods of time without injury will increase Outcome: Progressing   Problem: Education: Goal: Ability to make informed decisions regarding treatment will improve Outcome: Progressing   Problem: Coping: Goal: Coping ability will improve Outcome: Progressing   Problem: Health Behavior/Discharge Planning: Goal: Identification of resources available to assist in meeting health care needs will improve Outcome: Progressing   Problem: Medication: Goal: Compliance with prescribed medication regimen will improve Outcome: Progressing   Problem: Self-Concept: Goal: Ability to disclose and discuss suicidal ideas will improve Outcome: Progressing Goal: Will verbalize positive feelings about self Outcome: Progressing   Problem: Education: Goal: Ability to state activities that  reduce stress will improve Outcome: Progressing   Problem: Coping: Goal: Ability to identify and develop effective coping behavior will improve Outcome: Progressing   Problem: Self-Concept: Goal: Ability to identify factors that promote anxiety will improve Outcome: Progressing Goal: Level of anxiety will decrease Outcome: Progressing Goal: Ability to modify response to factors that promote anxiety will improve Outcome: Progressing

## 2022-02-19 NOTE — Progress Notes (Signed)
Patient appears stressed. Patient denies SI/HI/AVH. Pt reports "ok" sleep and normal appetite. Pt reports anxiety is 7/10 and depression 4/10. Pt is requesting discharge, MD notified. Patient complied with morning medication with no reported side effects. Patient remains safe on Q13min checks and contracts for safety.       02/19/22 0926  Psych Admission Type (Psych Patients Only)  Admission Status Voluntary  Psychosocial Assessment  Patient Complaints Anxiety;Depression  Eye Contact Fair  Facial Expression Anxious  Affect Anxious  Speech Logical/coherent  Interaction Assertive  Motor Activity Fidgety  Appearance/Hygiene Unremarkable  Behavior Characteristics Cooperative;Anxious  Mood Anxious  Thought Process  Coherency WDL  Content WDL  Delusions None reported or observed  Perception WDL  Hallucination None reported or observed  Judgment Limited  Confusion None  Danger to Self  Current suicidal ideation? Denies  Agreement Not to Harm Self Yes  Description of Agreement verbal  Danger to Others  Danger to Others None reported or observed
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
## 2022-02-19 NOTE — Group Note (Signed)
Recreation Therapy Group Note   Group Topic:Stress Management  Group Date: 02/19/2022 Start Time: 0935 End Time: 0950 Facilitators: Mattson Dayal-McCall, LRT,CTRS Location: [address] Dayroom   Goal Area(s) Addresses:  Patient will actively participate in stress management techniques presented during session.  Patient will successfully identify benefit of practicing stress management post d/c.   Group Description: Guided Imagery. LRT provided education, instruction, and demonstration on practice of visualization via guided imagery. Patient was asked to participate in the technique introduced during session. LRT debriefed including topics of mindfulness, stress management and specific scenarios each patient could use these techniques. Patients were given suggestions of ways to access scripts post d/c and encouraged to explore Youtube and other apps available on smartphones, tablets, and computers.   Affect/Mood: N/A   Participation Level: Did not attend    Clinical Observations/Individualized Feedback:     Plan: Continue to engage patient in RT group sessions 2-3x/week.   Makylah Bossard-McCall, LRT,CTRS 02/19/2022 12:27 PM

## 2022-02-19 NOTE — BH IP Treatment Plan (Signed)
Interdisciplinary Treatment and Diagnostic Plan Update  02/19/2022 Time of Session: [place] MRN: [id number]  Principal Diagnosis: MDD (major depressive disorder), recurrent severe, without psychosis ([place])  Secondary Diagnoses: Principal Problem:   MDD (major depressive disorder), recurrent severe, without psychosis ([place])   Current Medications:  Current Facility-Administered Medications  Medication Dose Route Frequency Provider Last Rate Last Admin   acetaminophen (TYLENOL) tablet 650 mg  650 mg Oral Q6H PRN Onuoha, Chinwendu V, NP       alum & mag hydroxide-simeth (MAALOX/MYLANTA) 200-200-20 MG/5ML suspension 30 mL  30 mL Oral Q4H PRN Onuoha, Chinwendu V, NP       magnesium hydroxide (MILK OF MAGNESIA) suspension 30 mL  30 mL Oral Daily PRN Onuoha, Chinwendu V, NP       PTA Medications: No medications prior to admission.    Patient Stressors: Financial difficulties   Marital or family conflict    Patient Strengths: Average or above average intelligence  Capable of independent living  General fund of knowledge  Motivation for treatment/growth   Treatment Modalities: Medication Management, Group therapy, Case management,  1 to 1 session with clinician, Psychoeducation, Recreational therapy.   Physician Treatment Plan for Primary Diagnosis: MDD (major depressive disorder), recurrent severe, without psychosis ([place]) Long Term Goal(s):     Short Term Goals:    Medication Management: Evaluate patient's response, side effects, and tolerance of medication regimen.  Therapeutic Interventions: 1 to 1 sessions, Unit Group sessions and Medication administration.  Evaluation of Outcomes: Progressing  Physician Treatment Plan for Secondary Diagnosis: Principal Problem:   MDD (major depressive disorder), recurrent severe, without psychosis ([place])  Long Term Goal(s):     Short Term Goals:       Medication Management: Evaluate patient's response, side effects, and tolerance  of medication regimen.  Therapeutic Interventions: 1 to 1 sessions, Unit Group sessions and Medication administration.  Evaluation of Outcomes: Progressing   RN Treatment Plan for Primary Diagnosis: MDD (major depressive disorder), recurrent severe, without psychosis ([place]) Long Term Goal(s): Knowledge of disease and therapeutic regimen to maintain health will improve  Short Term Goals: Ability to remain free from injury will improve, Ability to verbalize frustration and anger appropriately will improve, Ability to demonstrate self-control, Ability to participate in decision making will improve, Ability to verbalize feelings will improve, Ability to disclose and discuss suicidal ideas, Ability to identify and develop effective coping behaviors will improve, and Compliance with prescribed medications will improve  Medication Management: RN will administer medications as ordered by provider, will assess and evaluate patient's response and provide education to patient for prescribed medication. RN will report any adverse and/or side effects to prescribing provider.  Therapeutic Interventions: 1 on 1 counseling sessions, Psychoeducation, Medication administration, Evaluate responses to treatment, Monitor vital signs and CBGs as ordered, Perform/monitor CIWA, COWS, AIMS and Fall Risk screenings as ordered, Perform wound care treatments as ordered.  Evaluation of Outcomes: Progressing   LCSW Treatment Plan for Primary Diagnosis: MDD (major depressive disorder), recurrent severe, without psychosis ([place]) Long Term Goal(s): Safe transition to appropriate next level of care at discharge, Engage patient in therapeutic group addressing interpersonal concerns.  Short Term Goals: Engage patient in aftercare planning with referrals and resources, Increase social support, Increase ability to appropriately verbalize feelings, Increase emotional regulation, Facilitate acceptance of mental health diagnosis and  concerns, Facilitate patient progression through stages of change regarding substance use diagnoses and concerns, Identify triggers associated with mental health/substance abuse issues, and Increase skills for wellness and recovery  Therapeutic Interventions: Assess for all discharge needs, 1 to 1 time with Education officer, museum, Explore available resources and support systems, Assess for adequacy in community support network, Educate family and significant other(s) on suicide prevention, Complete Psychosocial Assessment, Interpersonal group therapy.  Evaluation of Outcomes: Progressing   Progress in Treatment: Attending groups: Yes. Participating in groups: Yes. Taking medication as prescribed: Yes. Toleration medication: Yes. Family/Significant other contact made: No, will contact:  Must obtain consent Patient understands diagnosis: Yes. Discussing patient identified problems/goals with staff: Yes. Medical problems stabilized or resolved: Yes. Denies suicidal/homicidal ideation: Yes. Issues/concerns per patient self-inventory: Yes. Other:   New problem(s) identified: No, Describe:  None Reported  New Short Term/Long Term Goal(s): Medication Stabilization  Patient Goals:  Coping Skills  Discharge Plan or Barriers: None Reported  Reason for Continuation of Hospitalization: Anxiety Depression Medication stabilization Suicidal ideation Withdrawal symptoms  Estimated Length of Stay: 3-7 Days  Last 3 [place] Suicide Severity Risk Score: Flowsheet Row Admission (Current) from 02/18/2022 in [place] [facility] Most recent reading at 02/18/2022  9:45 PM ED from 02/18/2022 in [facility] Most recent reading at 02/18/2022  1:57 PM ED from 06/09/2021 in [facility] [place] Most recent reading at 06/09/2021  1:56 PM  C-SSRS RISK CATEGORY High Risk No Risk No Risk       Last PHQ 2/9 Scores:    04/06/2021   10:47 AM   Depression screen PHQ 2/9  Decreased Interest 3  Down, Depressed, Hopeless 3  PHQ - 2 Score 6  Altered sleeping 3  Tired, decreased energy 1  Change in appetite 3  Feeling bad or failure about yourself  3  Trouble concentrating 3  Moving slowly or fidgety/restless 0  Suicidal thoughts 3  PHQ-9 Score 22   detox, medication management for mood stabilization; elimination of SI thoughts; development of comprehensive mental wellness/sobriety plan  Scribe for Treatment Team: Windle Guard, LCSW 02/19/2022 11:49 AM
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
## 2022-02-20 MED ORDER — LURASIDONE HCL 40 MG PO TABS
40.0000 mg | ORAL_TABLET | Freq: Every day | ORAL | Status: DC
  Administered 2022-02-21 – 2022-02-23 (×3): 40 mg via ORAL
  Filled 2022-02-20 (×4): qty 1

## 2022-02-20 MED ORDER — LURASIDONE HCL 20 MG PO TABS
20.0000 mg | ORAL_TABLET | Freq: Every day | ORAL | Status: AC
  Administered 2022-02-20: 20 mg via ORAL
  Filled 2022-02-20: qty 1

## 2022-02-20 NOTE — BHH Group Notes (Signed)
.  Psychoeducational Group Note    Date:  02/20/2022 Time:1300-1400    Purpose of Group: . The group focus' on teaching patients on how to identify their needs and their Life Skills:  A group where two lists are made. What people need and what are things that we do that are unhealthy. The lists are developed by the patients and it is explained that we often do the actions that are not healthy to get our list of needs met.  Goal:: to develop the coping skills needed to get their needs met  Participation Level:  Active  Participation Quality:  Appropriate  Affect:  Appropriate  Cognitive:  Oriented  Insight: Improving  Engagement in Group:  Engaged  Modes of Intervention:  Activity, Discussion, Education, and Support  Additional Comments:  Rates her energy at a 7/10. Participated fully in the group  Bryson Dames A

## 2022-02-20 NOTE — Progress Notes (Signed)
[name] has been calm and cooperative- pleasant- smiles upon approach- voiced no complaints this am other than some abdominal menses cramping. Per pt's self inventory, pt rated her depression,hopelessness and anxiety a 4/0/4, respectively. Pt has been visible in the milieu observed interacting well with peers, and observed attending groups. Pt currently denies SI/HI and AVH A. Labs and vitals monitored. Pt given Tylenol for menses cramps. Pt supported emotionally and encouraged to express concerns and ask questions.   R. Pt remains safe with 15 minute checks. Will continue POC.

## 2022-02-20 NOTE — Progress Notes (Signed)
Adult Psychoeducational Group Note  Date:  02/20/2022 Time:  8:49 PM  Group Topic/Focus:  Wrap-Up Group:   The focus of this group is to help patients review their daily goal of treatment and discuss progress on daily workbooks.  Participation Level:  Minimal  Participation Quality:  Appropriate  Affect:  Appropriate  Cognitive:  Appropriate  Insight: Improving and Lacking  Engagement in Group:  Lacking  Modes of Intervention:  Discussion  Additional Comments:  Pt did attend group, but did not share her goal for the day or if she was able to meet goal.  Tonia Brooms D 02/20/2022, 8:49 PM

## 2022-02-20 NOTE — Progress Notes (Signed)
[facility] MD Progress Note  02/20/2022 11:42 AM Alisha Washington  MRN:  [id number] Subjective:   Alisha Washington is a 27 year old female with a diagnosis of bipolar affective disorder 1, currently depressed.  She presented with suicidal thoughts with a plan to overdose on medication.  Stressors include a recent break-up with her partner.  She was admitted to the [facility] behavioral health hospital on a voluntary basis.  Today on interview the patient reports "I want to go home".  She states she does not like being in the hospital because she is uncomfortable in a new environment.  She reports that she has to return to work on Tuesday and wants to be discharged on Monday.  Of note, on discussion with the attending psychiatrist, the patient was tearful and endorsed more significant depressive symptoms.  She denies experiencing medication side effects or suicidal thoughts.  Discussed her diagnosis and increasing Latuda for tomorrow.  Principal Problem: Bipolar 1 disorder, depressed, severe ([place]) Diagnosis: Principal Problem:   Bipolar 1 disorder, depressed, severe ([facility]) Active Problems:   GAD (generalized anxiety disorder)   PTSD (post-traumatic stress disorder)   Cannabis abuse  Total Time spent with patient: 20 minutes  Past Psychiatric History: as above  Past Medical History:  Past Medical History:  Diagnosis Date   Deliberate self-cutting    Panic attacks    Scoliosis    Seasonal allergies    History reviewed. No pertinent surgical history. Family History:  Family History  Problem Relation Age of Onset   Diabetes Father    Family Psychiatric  History: none Social History:  Social History   Substance and Sexual Activity  Alcohol Use No     Social History   Substance and Sexual Activity  Drug Use Yes   Types: Marijuana    Social History   Socioeconomic History   Marital status: Single    Spouse name: Not on file   Number of children: Not on file   Years of education: Not on file    Highest education level: Not on file  Occupational History   Not on file  Tobacco Use   Smoking status: Some Days    Types: Cigars   Smokeless tobacco: Never  Vaping Use   Vaping Use: Every day   Substances: Nicotine  Substance and Sexual Activity   Alcohol use: No   Drug use: Yes    Types: Marijuana   Sexual activity: Yes    Birth control/protection: Condom  Other Topics Concern   Not on file  Social History Narrative   Not on file   Social Determinants of Health   Financial Resource Strain: Not on file  Food Insecurity: No Food Insecurity (02/18/2022)   Hunger Vital Sign    Worried About Running Out of Food in the Last Year: Never true    Ran Out of Food in the Last Year: Never true  Transportation Needs: No Transportation Needs (02/18/2022)   PRAPARE - Hydrologist (Medical): No    Lack of Transportation (Non-Medical): No  Physical Activity: Not on file  Stress: Not on file  Social Connections: Not on file   Additional Social History:                         Sleep: Fair  Appetite:  Fair  Current Medications: Current Facility-Administered Medications  Medication Dose Route Frequency Provider Last Rate Last Admin   acetaminophen (TYLENOL) tablet 650 mg  650  mg Oral Q6H PRN Onuoha, Chinwendu V, NP       alum & mag hydroxide-simeth (MAALOX/MYLANTA) 200-200-20 MG/5ML suspension 30 mL  30 mL Oral Q4H PRN Onuoha, Chinwendu V, NP       hydrOXYzine (ATARAX) tablet 25 mg  25 mg Oral TID PRN Massengill, Harrold Donath, MD       lurasidone (LATUDA) tablet 20 mg  20 mg Oral Q supper Massengill, Harrold Donath, MD   20 mg at 02/19/22 1651   magnesium hydroxide (MILK OF MAGNESIA) suspension 30 mL  30 mL Oral Daily PRN Onuoha, Chinwendu V, NP       nicotine polacrilex (NICORETTE) gum 2 mg  2 mg Oral PRN Massengill, Harrold Donath, MD       traZODone (DESYREL) tablet 50 mg  50 mg Oral QHS PRN Massengill, Harrold Donath, MD        Lab Results:  Results for orders placed  or performed during the hospital encounter of 02/18/22 (from the past 48 hour(s))  POC urine preg, ED     Status: Normal   Collection Time: 02/18/22  1:40 PM  Result Value Ref Range   Preg Test, Ur Negative Negative  POCT Urine Drug Screen - (I-Screen)     Status: Abnormal   Collection Time: 02/18/22  1:40 PM  Result Value Ref Range   POC Amphetamine UR None Detected NONE DETECTED (Cut Off Level 1000 ng/mL)   POC Secobarbital (BAR) None Detected NONE DETECTED (Cut Off Level 300 ng/mL)   POC Buprenorphine (BUP) None Detected NONE DETECTED (Cut Off Level 10 ng/mL)   POC Oxazepam (BZO) None Detected NONE DETECTED (Cut Off Level 300 ng/mL)   POC Cocaine UR None Detected NONE DETECTED (Cut Off Level 300 ng/mL)   POC Methamphetamine UR None Detected NONE DETECTED (Cut Off Level 1000 ng/mL)   POC Morphine None Detected NONE DETECTED (Cut Off Level 300 ng/mL)   POC Methadone UR None Detected NONE DETECTED (Cut Off Level 300 ng/mL)   POC Oxycodone UR None Detected NONE DETECTED (Cut Off Level 100 ng/mL)   POC Marijuana UR Positive (A) NONE DETECTED (Cut Off Level 50 ng/mL)  Resp panel by RT-PCR (RSV, Flu A&B, Covid) Anterior Nasal Swab     Status: None   Collection Time: 02/18/22  2:54 PM   Specimen: Anterior Nasal Swab  Result Value Ref Range   SARS Coronavirus 2 by RT PCR NEGATIVE NEGATIVE    Comment: (NOTE) SARS-CoV-2 target nucleic acids are NOT DETECTED.  The SARS-CoV-2 RNA is generally detectable in upper respiratory specimens during the acute phase of infection. The lowest concentration of SARS-CoV-2 viral copies this assay can detect is 138 copies/mL. A negative result does not preclude SARS-Cov-2 infection and should not be used as the sole basis for treatment or other patient management decisions. A negative result may occur with  improper specimen collection/handling, submission of specimen other than nasopharyngeal swab, presence of viral mutation(s) within the areas targeted by  this assay, and inadequate number of viral copies(<138 copies/mL). A negative result must be combined with clinical observations, patient history, and epidemiological information. The expected result is Negative.  Fact Sheet for Patients:  BloggerCourse.com  Fact Sheet for Healthcare Providers:  SeriousBroker.it  This test is no t yet approved or cleared by the [place] FDA and  has been authorized for detection and/or diagnosis of SARS-CoV-2 by FDA under an Emergency Use Authorization (EUA). This EUA will remain  in effect (meaning this test can be used) for the duration of  the COVID-19 declaration under Section 564(b)(1) of the Act, 21 U.S.C.section 360bbb-3(b)(1), unless the authorization is terminated  or revoked sooner.       Influenza A by PCR NEGATIVE NEGATIVE   Influenza B by PCR NEGATIVE NEGATIVE    Comment: (NOTE) The Xpert Xpress SARS-CoV-2/FLU/RSV plus assay is intended as an aid in the diagnosis of influenza from Nasopharyngeal swab specimens and should not be used as a sole basis for treatment. Nasal washings and aspirates are unacceptable for Xpert Xpress SARS-CoV-2/FLU/RSV testing.  Fact Sheet for Patients: BloggerCourse.com  Fact Sheet for Healthcare Providers: SeriousBroker.it  This test is not yet approved or cleared by the [place] FDA and has been authorized for detection and/or diagnosis of SARS-CoV-2 by FDA under an Emergency Use Authorization (EUA). This EUA will remain in effect (meaning this test can be used) for the duration of the COVID-19 declaration under Section 564(b)(1) of the Act, 21 U.S.C. section 360bbb-3(b)(1), unless the authorization is terminated or revoked.     Resp Syncytial Virus by PCR NEGATIVE NEGATIVE    Comment: (NOTE) Fact Sheet for Patients: BloggerCourse.com  Fact Sheet for Healthcare  Providers: SeriousBroker.it  This test is not yet approved or cleared by the [place] FDA and has been authorized for detection and/or diagnosis of SARS-CoV-2 by FDA under an Emergency Use Authorization (EUA). This EUA will remain in effect (meaning this test can be used) for the duration of the COVID-19 declaration under Section 564(b)(1) of the Act, 21 U.S.C. section 360bbb-3(b)(1), unless the authorization is terminated or revoked.  Performed at [facility], [address] [place], [place] [address]   Comprehensive metabolic panel     Status: Abnormal   Collection Time: 02/18/22  7:03 PM  Result Value Ref Range   Sodium 137 135 - 145 mmol/L   Potassium 3.4 (L) 3.5 - 5.1 mmol/L   Chloride 107 98 - 111 mmol/L   CO2 19 (L) 22 - 32 mmol/L   Glucose, Bld 87 70 - 99 mg/dL    Comment: Glucose reference range applies only to samples taken after fasting for at least 8 hours.   BUN 5 (L) 6 - 20 mg/dL   Creatinine, Ser 5.57 0.44 - 1.00 mg/dL   Calcium 9.2 8.9 - 32.2 mg/dL   Total Protein 7.5 6.5 - 8.1 g/dL   Albumin 4.3 3.5 - 5.0 g/dL   AST 19 15 - 41 U/L   ALT 16 0 - 44 U/L   Alkaline Phosphatase 67 38 - 126 U/L   Total Bilirubin 1.4 (H) 0.3 - 1.2 mg/dL   GFR, Estimated >02 >54 mL/min    Comment: (NOTE) Calculated using the CKD-EPI Creatinine Equation (2021)    Anion gap 11 5 - 15    Comment: Performed at [facility], [address] [place], [place] [address]  TSH     Status: None   Collection Time: 02/18/22  7:03 PM  Result Value Ref Range   TSH 2.495 0.350 - 4.500 uIU/mL    Comment: Performed by a 3rd Generation assay with a functional sensitivity of <=0.01 uIU/mL. Performed at [facility], [address] [place], [place] [address]   Lipid panel     Status: None   Collection Time: 02/18/22  7:03 PM  Result Value Ref Range   Cholesterol 151 0 - 200 mg/dL   Triglycerides 93 <315 mg/dL   HDL 57 >17 mg/dL   Total CHOL/HDL  Ratio 2.6 RATIO  VLDL 19 0 - 40 mg/dL   LDL Cholesterol 75 0 - 99 mg/dL    Comment:        Total Cholesterol/HDL:CHD Risk Coronary Heart Disease Risk Table                     Men   Women  1/2 Average Risk   3.4   3.3  Average Risk       5.0   4.4  2 X Average Risk   9.6   7.1  3 X Average Risk  23.4   11.0        Use the calculated Patient Ratio above and the CHD Risk Table to determine the patient's CHD Risk.        ATP III CLASSIFICATION (LDL):  <100     mg/dL   Optimal  100-129  mg/dL   Near or Above                    Optimal  130-159  mg/dL   Borderline  160-189  mg/dL   High  >190     mg/dL   Very High Performed at [facility] [address], [place], [place] [address]     Blood Alcohol level:  Lab Results  Component Value Date   [facility] <10 04/06/2021   ETH <10 95/18/8416    Metabolic Disorder Labs: Lab Results  Component Value Date   HGBA1C 5.0 04/06/2021   MPG 96.8 04/06/2021   MPG 105.41 11/21/2016   No results found for: "PROLACTIN" Lab Results  Component Value Date   CHOL 151 02/18/2022   TRIG 93 02/18/2022   HDL 57 02/18/2022   CHOLHDL 2.6 02/18/2022   VLDL 19 02/18/2022   LDLCALC 75 02/18/2022   LDLCALC 84 04/06/2021    Psychiatric Specialty Exam: Physical Exam Constitutional:      Appearance: the patient is not toxic-appearing.  Pulmonary:     Effort: Pulmonary effort is normal.  Neurological:     General: No focal deficit present.     Mental Status: the patient is alert and oriented to person, place, and time.   Review of Systems  Respiratory:  Negative for shortness of breath.   Cardiovascular:  Negative for chest pain.  Gastrointestinal:  Negative for abdominal pain, constipation, diarrhea, nausea and vomiting.  Neurological:  Negative for headaches.      BP 107/74 (BP Location: Right Arm)   Pulse 87   Temp 97.7 F (36.5 C) (Oral)   Resp 16   Ht 5\' 5"  (1.651 m)   SpO2 98%   BMI 33.12 kg/m   General Appearance:  Fairly Groomed  Eye Contact:  Good  Speech:  Clear and Coherent  Volume:  Normal  Mood:  depressed, sad  Affect:  Congruent  Thought Process:  Coherent  Orientation:  Full (Time, Place, and Person)  Thought Content: Logical   Suicidal Thoughts:  No  Homicidal Thoughts:  No  Memory:  Immediate;   Good  Judgement:  poor  Insight:  fair  Psychomotor Activity:  Normal  Concentration:  Concentration: Good  Recall:  Good  Fund of Knowledge: Good  Language: Good  Akathisia:  No  Handed:    AIMS (if indicated): not done  Assets:  Communication Skills Desire for Improvement Financial Resources/Insurance Housing Leisure Time Physical Health  ADL's:  Intact  Cognition: WNL  Sleep:  Fair   Treatment Plan Summary: Daily contact with patient to  assess and evaluate symptoms and progress in treatment and Medication management  ASSESSMENT:   Diagnoses / Active Problems: -Bipolar disorder, type I, current episode is depressed -GAD -PTSD -Cannabis abuse -Tobacco use   PLAN: Safety and Monitoring:             --  Voluntary admission to inpatient psychiatric unit for safety, stabilization and treatment             -- Daily contact with patient to assess and evaluate symptoms and progress in treatment             -- Patient's case to be discussed in multi-disciplinary team meeting             -- Observation Level : q15 minute checks             -- Vital signs:  q12 hours             -- Precautions: suicide, elopement, and assault   2. Psychiatric Diagnoses and Treatment:  -Increase Latuda to 40 mg daily with food (will need to counsel patient on taking this medication with food) - Consider adding antidepressant --  The risks/benefits/side-effects/alternatives to this medication were discussed in detail with the patient and time was given for questions. The patient consents to medication trial.                -- Metabolic profile and EKG monitoring obtained while on an atypical  antipsychotic (BMI: Lipid Panel: HbgA1c: QTc:)              -- Encouraged patient to participate in unit milieu and in scheduled group therapies              -- Short Term Goals: Ability to identify changes in lifestyle to reduce recurrence of condition will improve, Ability to verbalize feelings will improve, Ability to disclose and discuss suicidal ideas, Ability to demonstrate self-control will improve, Ability to identify and develop effective coping behaviors will improve, Ability to maintain clinical measurements within normal limits will improve, Compliance with prescribed medications will improve, and Ability to identify triggers associated with substance abuse/mental health issues will improve             -- Long Term Goals: Improvement in symptoms so as ready for discharge                3. Medical Issues Being Addressed:              Tobacco Use Disorder             -- Nicotine  gum prn              -- Smoking cessation encouraged   4. Discharge Planning:              -- Social work and case management to assist with discharge planning and identification of hospital follow-up needs prior to discharge             -- Estimated LOS: 5-7 days             -- Discharge Concerns: Need to establish a safety plan; Medication compliance and effectiveness             -- Discharge Goals: Return home with outpatient referrals for mental health follow-up including medication management/psychotherapy  -- Patient requesting discharge on Monday because she has to go to work on Tuesday.  Do not know how feasible this is given lack of outpatient  follow-up arrangement and recent arrival to the unit.  It is notable that the patient presented to [facility] with suicidal thoughts, actively seeking help, and does not appear to have attempted suicide previously.    Carlyn Reichert, MD 02/20/2022, 11:42 AM

## 2022-02-20 NOTE — Progress Notes (Signed)
   02/20/22 2203  Psych Admission Type (Psych Patients Only)  Admission Status Voluntary  Psychosocial Assessment  Patient Complaints None  Eye Contact Fair  Facial Expression Flat  Affect Appropriate to circumstance  Speech Logical/coherent  Interaction Assertive  Motor Activity Other (Comment) (WDL)  Appearance/Hygiene Unremarkable  Behavior Characteristics Appropriate to situation  Mood Pleasant  Thought Process  Coherency WDL  Content WDL  Delusions None reported or observed  Perception WDL  Hallucination None reported or observed  Judgment Poor  Confusion None  Danger to Self  Current suicidal ideation? Denies  Agreement Not to Harm Self Yes  Description of Agreement verbal  Danger to Others  Danger to Others None reported or observed

## 2022-02-21 DIAGNOSIS — F314 Bipolar disorder, current episode depressed, severe, without psychotic features: Principal | ICD-10-CM

## 2022-02-21 NOTE — Progress Notes (Addendum)
[name] presents with an anxious affect, smiles and is friendly during interactions. Pt rated her depression, hopelessness and anxiety a 3/0/5, respectively. Pt has been visible in the milieu, observed interacting well with peers and attending groups. Pt denies SI/HI and AVH  A. Labs and vitals monitored. Pt given and educated on medications. Pt supported emotionally and encouraged to express concerns and ask questions.   R. Pt remains safe with 15 minute checks. Will continue POC.   02/21/22 1500  Psych Admission Type (Psych Patients Only)  Admission Status Voluntary  Psychosocial Assessment  Patient Complaints None  Eye Contact Fair  Facial Expression Flat  Affect Appropriate to circumstance  Speech Logical/coherent  Interaction Assertive  Motor Activity Other (Comment) (steady gait)  Appearance/Hygiene Unremarkable  Behavior Characteristics Cooperative;Appropriate to situation  Mood Pleasant  Aggressive Behavior  Effect No apparent injury  Thought Process  Coherency WDL  Content WDL  Delusions None reported or observed  Perception WDL  Hallucination None reported or observed  Judgment Poor  Confusion None  Danger to Self  Current suicidal ideation? Denies  Agreement Not to Harm Self Yes  Description of Agreement verbal contract for safety  Danger to Others  Danger to Others None reported or observed

## 2022-02-21 NOTE — [facility] Group Notes (Signed)
Adult Psychoeducational Group  Date:  02/21/2022 Time: 1300-1400  Group Topic/Focus: Continuation of the group from Saturday. Looking at the lists that were created and talking about what needs to be done with the homework of 30 positives about themselves.                                     Talking about taking their power back and helping themselves to develop a positive self esteem.      Participation Quality:  Appropriate  Affect:  Appropriate  Cognitive:  Oriented  Insight: Improving  Engagement in Group:  Engaged  Modes of Intervention:  Activity, Discussion, Education, and Support  Additional Comments:  Rates her energy at a 7/10. Participated fully in the group.  Paulino Rily

## 2022-02-21 NOTE — Progress Notes (Signed)
   02/21/22 2250  Psych Admission Type (Psych Patients Only)  Admission Status Voluntary  Psychosocial Assessment  Patient Complaints None  Eye Contact Fair  Facial Expression Animated  Affect Appropriate to circumstance  Speech Logical/coherent  Interaction Assertive  Motor Activity Other (Comment) (WDL)  Appearance/Hygiene Unremarkable  Behavior Characteristics Appropriate to situation  Mood Pleasant  Thought Process  Coherency WDL  Content WDL  Delusions None reported or observed  Perception WDL  Hallucination None reported or observed  Judgment Poor  Confusion None  Danger to Self  Current suicidal ideation? Denies  Agreement Not to Harm Self Yes  Description of Agreement verbal  Danger to Others  Danger to Others None reported or observed

## 2022-02-21 NOTE — Progress Notes (Signed)
[facility] MD Progress Note  02/21/2022 3:15 PM Alisha Washington  MRN:  [id number] Subjective:   Alisha Washington is a 27 year old female with a diagnosis of bipolar affective disorder 1, currently depressed.  She presented with suicidal thoughts with a plan to overdose on medication.  Stressors include a recent break-up with her partner.  She was admitted to the [facility] behavioral health hospital on a voluntary basis.  Today on interview the patient is focused on going home.  She reports that she has to be home in person to pay her electricity bill for her 3 roommates, otherwise the electricity will be shut off.  Asked the patient who we can contact for collateral information.  She states that we cannot call her family or her previous partner.  She is unsure if wants [place] to talk to her roommates.  She says she will call them and discuss and get back to [place] later.    Patient reports slightly increased anxiety compared to yesterday.  Assessed the patient's ability to engage in future oriented thinking.  She is able to stay well thought goals regarding quitting smoking, finding a therapist, and developing a consistent sleep schedule to help manage her bipolar disorder.  The patient denies auditory/visual hallucinations and first rank symptoms.  She reports good mood, appetite, and sleep. She denies suicidal and homicidal thoughts. She denies side effects from her medications.  Review of systems as below.   Principal Problem: Bipolar 1 disorder, depressed, severe ([place]) Diagnosis: Principal Problem:   Bipolar 1 disorder, depressed, severe ([facility]) Active Problems:   GAD (generalized anxiety disorder)   PTSD (post-traumatic stress disorder)   Cannabis abuse  Total Time spent with patient: 20 minutes  Past Psychiatric History: as above  Past Medical History:  Past Medical History:  Diagnosis Date   Deliberate self-cutting    Panic attacks    Scoliosis    Seasonal allergies    History reviewed. No pertinent surgical  history. Family History:  Family History  Problem Relation Age of Onset   Diabetes Father    Family Psychiatric  History: none Social History:  Social History   Substance and Sexual Activity  Alcohol Use No     Social History   Substance and Sexual Activity  Drug Use Yes   Types: Marijuana    Social History   Socioeconomic History   Marital status: Single    Spouse name: Not on file   Number of children: Not on file   Years of education: Not on file   Highest education level: Not on file  Occupational History   Not on file  Tobacco Use   Smoking status: Some Days    Types: Cigars   Smokeless tobacco: Never  Vaping Use   Vaping Use: Every day   Substances: Nicotine  Substance and Sexual Activity   Alcohol use: No   Drug use: Yes    Types: Marijuana   Sexual activity: Yes    Birth control/protection: Condom  Other Topics Concern   Not on file  Social History Narrative   Not on file   Social Determinants of Health   Financial Resource Strain: Not on file  Food Insecurity: No Food Insecurity (02/18/2022)   Hunger Vital Sign    Worried About Running Out of Food in the Last Year: Never true    Ran Out of Food in the Last Year: Never true  Transportation Needs: No Transportation Needs (02/18/2022)   PRAPARE - Transportation    Lack of  Transportation (Medical): No    Lack of Transportation (Non-Medical): No  Physical Activity: Not on file  Stress: Not on file  Social Connections: Not on file   Additional Social History:                         Sleep: Fair  Appetite:  Fair  Current Medications: Current Facility-Administered Medications  Medication Dose Route Frequency Provider Last Rate Last Admin   acetaminophen (TYLENOL) tablet 650 mg  650 mg Oral Q6H PRN Onuoha, Chinwendu V, NP   650 mg at 02/20/22 1152   alum & mag hydroxide-simeth (MAALOX/MYLANTA) 200-200-20 MG/5ML suspension 30 mL  30 mL Oral Q4H PRN Onuoha, Chinwendu V, NP        hydrOXYzine (ATARAX) tablet 25 mg  25 mg Oral TID PRN Massengill, Ovid Curd, MD       lurasidone (LATUDA) tablet 40 mg  40 mg Oral Q breakfast Corky Sox, MD   40 mg at 02/21/22 0750   magnesium hydroxide (MILK OF MAGNESIA) suspension 30 mL  30 mL Oral Daily PRN Onuoha, Chinwendu V, NP       nicotine polacrilex (NICORETTE) gum 2 mg  2 mg Oral PRN Massengill, Ovid Curd, MD       traZODone (DESYREL) tablet 50 mg  50 mg Oral QHS PRN Massengill, Nathan, MD        Lab Results:  No results found for this or any previous visit (from the past 48 hour(s)).   Blood Alcohol level:  Lab Results  Component Value Date   ETH <10 04/06/2021   ETH <10 31/54/0086    Metabolic Disorder Labs: Lab Results  Component Value Date   HGBA1C 5.0 04/06/2021   MPG 96.8 04/06/2021   MPG 105.41 11/21/2016   No results found for: "PROLACTIN" Lab Results  Component Value Date   CHOL 151 02/18/2022   TRIG 93 02/18/2022   HDL 57 02/18/2022   CHOLHDL 2.6 02/18/2022   VLDL 19 02/18/2022   LDLCALC 75 02/18/2022   LDLCALC 84 04/06/2021    Psychiatric Specialty Exam: Physical Exam Constitutional:      Appearance: the patient is not toxic-appearing.  Pulmonary:     Effort: Pulmonary effort is normal.  Neurological:     General: No focal deficit present.     Mental Status: the patient is alert and oriented to person, place, and time.   Review of Systems  Respiratory:  Negative for shortness of breath.   Cardiovascular:  Negative for chest pain.  Gastrointestinal:  Negative for abdominal pain, constipation, diarrhea, nausea and vomiting.  Neurological:  Negative for headaches.      BP 112/79 (BP Location: Left Arm)   Pulse 78   Temp 98.5 F (36.9 C) (Oral)   Resp (!) 22   Ht 5\' 5"  (1.651 m)   SpO2 100%   BMI 33.12 kg/m   General Appearance: Fairly Groomed  Eye Contact:  Good  Speech:  Clear and Coherent  Volume:  Normal  Mood:  depressed, sad  Affect:  Congruent  Thought Process:  Coherent   Orientation:  Full (Time, Place, and Person)  Thought Content: Logical   Suicidal Thoughts:  No  Homicidal Thoughts:  No  Memory:  Immediate;   Good  Judgement:  poor  Insight:  fair  Psychomotor Activity:  Normal  Concentration:  Concentration: Good  Recall:  Good  Fund of Knowledge: Good  Language: Good  Akathisia:  No  Handed:  AIMS (if indicated): not done  Assets:  Communication Skills Desire for Improvement Financial Resources/Insurance Housing Leisure Time Physical Health  ADL's:  Intact  Cognition: WNL  Sleep:  Fair   Treatment Plan Summary: Daily contact with patient to assess and evaluate symptoms and progress in treatment and Medication management  ASSESSMENT:   Diagnoses / Active Problems: -Bipolar disorder, type I, current episode is depressed -GAD -PTSD -Cannabis abuse -Tobacco use   PLAN: Safety and Monitoring:             --  Voluntary admission to inpatient psychiatric unit for safety, stabilization and treatment             -- Daily contact with patient to assess and evaluate symptoms and progress in treatment             -- Patient's case to be discussed in multi-disciplinary team meeting             -- Observation Level : q15 minute checks             -- Vital signs:  q12 hours             -- Precautions: suicide, elopement, and assault   2. Psychiatric Diagnoses and Treatment:  - Continue Latuda 40 mg daily with food for bipolar depression - Counseled patient on need to take this medication with a meal each day - Can consider adding antidepressant --  The risks/benefits/side-effects/alternatives to this medication were discussed in detail with the patient and time was given for questions. The patient consents to medication trial.                -- Metabolic profile and EKG monitoring obtained while on an atypical antipsychotic (BMI: Lipid Panel: HbgA1c: QTc:)              -- Encouraged patient to participate in unit milieu and in scheduled  group therapies              -- Short Term Goals: Ability to identify changes in lifestyle to reduce recurrence of condition will improve, Ability to verbalize feelings will improve, Ability to disclose and discuss suicidal ideas, Ability to demonstrate self-control will improve, Ability to identify and develop effective coping behaviors will improve, Ability to maintain clinical measurements within normal limits will improve, Compliance with prescribed medications will improve, and Ability to identify triggers associated with substance abuse/mental health issues will improve             -- Long Term Goals: Improvement in symptoms so as ready for discharge                3. Medical Issues Being Addressed:              Tobacco Use Disorder             -- Nicotine  gum prn              -- Smoking cessation encouraged   4. Discharge Planning:              -- Social work and case management to assist with discharge planning and identification of hospital follow-up needs prior to discharge             -- Estimated LOS: 5-7 days             -- Discharge Concerns: Need to establish a safety plan; Medication compliance and effectiveness             --  Discharge Goals: Return home with outpatient referrals for mental health follow-up including medication management/psychotherapy  -- Patient requesting discharge on Monday  - Unable to call and receive reassuring collateral and perform safety planning - Patient may benefit from [facility] program.  She reports a bad experience with a counselor at [facility].  May benefit from [facility] given that she has Pharmacist, community.   Corky Sox, MD 02/21/2022, 3:15 PM

## 2022-02-21 NOTE — Group Note (Signed)
LCSW Group Therapy Note   Group Date: 02/21/2022 Start Time: 1000 End Time: 1100  Type of Therapy and Topic:  Group Therapy: Are You Under Stress?!?!  Participation Level:  Minimal  Description of Group: This process group involved patients examining stress and how it impacts their lives. Distress and eustress definitions were explained and patients identified both good and bad stressors in their lives. Accompanying worksheet was used to help patients identify the physical and emotional symptoms of stress and techniques/copings skills they utilize to help reduce these symptoms.    Therapeutic Goals:  1.  Patients will talk about their experiences with distress and eustress. 2. Patients will identify stressors in their lives and the physical and emotional  symptoms they experience while under stress. 3. Patients to identify actions/coping skills that they utilize to help ameliorate  symptoms of stress.  Summary of Patient Progress:  Patient was present in group. Patient accepted the provided worksheet. Patient completed her self care assessment on her own and did not share any insight or provide any examples. Patient remain in group the entire time.    Therapeutic Modalities:  Cognitive Behavioral Therapy Solution-Focused Therapy  [name], LCSWA 02/21/2022  2:04 PM

## 2022-02-21 NOTE — [facility] Group Notes (Signed)
Adult Psychoeducational Group Note Date:  02/21/2022 Time:  0900-1000 Group Topic/Focus: PROGRESSIVE RELAXATION. A group where deep breathing is taught and tensing and relaxation muscle groups is used. Imagery is used as well.  Pts are asked to imagine 3 pillars that hold them up when they are not able to hold themselves up and to share that with the group.   Participation Level:  Active  Participation Quality:  Appropriate  Affect:  Appropriate  Cognitive:  Approprate  Insight: Improving  Engagement in Group:  Engaged  Modes of Intervention:  deep breathing, Imagery. Discussion  Additional Comments:  rates energy at a 7/10. Passed on telling the group what would hold her up.   : Paulino Rily

## 2022-02-22 NOTE — Group Note (Signed)
Date:  02/22/2022 Time:  10:54 AM  Group Topic/Focus:  Goals Group:   The focus of this group is to help patients establish daily goals to achieve during treatment and discuss how the patient can incorporate goal setting into their daily lives to aide in recovery.    Participation Level:  Active  Participation Quality:  Appropriate  Affect:  Appropriate  Cognitive:  Appropriate  Insight: Appropriate  Engagement in Group:  Engaged  Modes of Intervention:  Discussion  Additional Comments:     Jerrye Beavers 02/22/2022, 10:54 AM

## 2022-02-22 NOTE — [facility] Suicide Risk Assessment (Signed)
Alisha Washington INPATIENT:  Family/Significant Other Suicide Prevention Education  Suicide Prevention Education:  Education Completed; Alisha Washington (roommate) [phone],  (name of family member/significant other) has been identified by the patient as the family member/significant other with whom the patient will be residing, and identified as the person(s) who will aid the patient in the event of a mental health crisis (suicidal ideations/suicide attempt).  With written consent from the patient, the family member/significant other has been provided the following suicide prevention education, prior to the and/or following the discharge of the patient.  The suicide prevention education provided includes the following: Suicide risk factors Suicide prevention and interventions National Suicide Hotline telephone number [facility] assessment telephone number [facility] Emergency Assistance [place] and/or Residential Mobile Crisis Unit telephone number  Request made of family/significant other to: Remove weapons (e.g., guns, rifles, knives), all items previously/currently identified as safety concern.   Remove drugs/medications (over-the-counter, prescriptions, illicit drugs), all items previously/currently identified as a safety concern.  The family member/significant other verbalizes understanding of the suicide prevention education information provided.  The family member/significant other agrees to remove the items of safety concern listed above.  CSW spoke with patient roommate Alisha Washington to complete patient safety planning. Roommates states how she knows a little about what is going on and feels that patient bf is the reason why she is here. Roommate states that she has been trying for two days to get patient boyfriend furniture and clothes out the house before patient returns, but denies any safety concerns. Also, confirmed that their were no guns or weapons in home. Roommate will be  picking patient up once she is ready to DC.   Alisha Washington 02/22/2022, 11:27 AM

## 2022-02-22 NOTE — Progress Notes (Signed)
[facility] MD Progress Note  02/22/2022 3:16 PM Alisha Washington  MRN:  [id number]  Subjective:   Alisha Washington is a 27 year old female with a diagnosis of bipolar affective disorder 1, currently depressed. She presented with suicidal thoughts with a plan to overdose on medication. Stressors include a recent break-up with her partner. She was admitted to the [facility] on a voluntary basis.   On assessment today, the pt reports her mood is much better since admission. Reports sleep is better and appetite is stable. Not tearful during interview today and not observed to be tearful in milieu. She is future oriented. Denying s/e to medications. Denying SI, HI.   I called roommate with pt permission today Alisha Washington [phone] - roommate states that pt seems to be doing better. RM concerned about possible discharge today bc pt's ex-bf still has lots of stuff in their house ant this would be a trigger "and she would be going back to the same triggers that made her have to come in in the first place." RM states she is planning to have ex-bf (or herself if he doesn't arrive) to clean out all the ex-bf items.  Discussed this with patient and she is agreeable that items should be removed to reduce potential for trigger of SI, and will plan to dc tomorrow.      Principal Problem: Bipolar 1 disorder, depressed, severe ([place]) Diagnosis: Principal Problem:   Bipolar 1 disorder, depressed, severe ([facility]) Active Problems:   GAD (generalized anxiety disorder)   PTSD (post-traumatic stress disorder)   Cannabis abuse  Total Time spent with patient: 15 minutes Spent 10 minutes on phone with roommate.   Past Psychiatric History:  MDD versus bipolar disorder, GAD.  Most recently receiving outpatient psychiatric care at [facility].  This is her third psychiatric hospitalization.  She reports a history of self-injurious behavior by cutting.  Patient reports taking no outpatient psychiatric medications  leading up to this admission.  Patient reports past psychiatric medication history including Abilify, Zoloft, Lexapro, Effexor, Prozac.    Past Medical History:  Past Medical History:  Diagnosis Date   Deliberate self-cutting    Panic attacks    Scoliosis    Seasonal allergies    History reviewed. No pertinent surgical history. Family History:  Family History  Problem Relation Age of Onset   Diabetes Father    Family Psychiatric  History:  Patient reports mom recently told her she was diagnosed with something but the patient does not recall. Reports her uncle committed suicide.     Social History:  Social History   Substance and Sexual Activity  Alcohol Use No     Social History   Substance and Sexual Activity  Drug Use Yes   Types: Marijuana    Social History   Socioeconomic History   Marital status: Single    Spouse name: Not on file   Number of children: Not on file   Years of education: Not on file   Highest education level: Not on file  Occupational History   Not on file  Tobacco Use   Smoking status: Some Days    Types: Cigars   Smokeless tobacco: Never  Vaping Use   Vaping Use: Every day   Substances: Nicotine  Substance and Sexual Activity   Alcohol use: No   Drug use: Yes    Types: Marijuana   Sexual activity: Yes    Birth control/protection: Condom  Other Topics Concern   Not on file  Social History Narrative   Not on file   Social Determinants of Health   Financial Resource Strain: Not on file  Food Insecurity: No Food Insecurity (02/18/2022)   Hunger Vital Sign    Worried About Running Out of Food in the Last Year: Never true    Ran Out of Food in the Last Year: Never true  Transportation Needs: No Transportation Needs (02/18/2022)   PRAPARE - Administrator, Civil Service (Medical): No    Lack of Transportation (Non-Medical): No  Physical Activity: Not on file  Stress: Not on file  Social Connections: Not on file    Additional Social History:                         Sleep: Good  Appetite:  Good  Current Medications: Current Facility-Administered Medications  Medication Dose Route Frequency Provider Last Rate Last Admin   acetaminophen (TYLENOL) tablet 650 mg  650 mg Oral Q6H PRN Onuoha, Chinwendu V, NP   650 mg at 02/20/22 1152   alum & mag hydroxide-simeth (MAALOX/MYLANTA) 200-200-20 MG/5ML suspension 30 mL  30 mL Oral Q4H PRN Onuoha, Chinwendu V, NP       hydrOXYzine (ATARAX) tablet 25 mg  25 mg Oral TID PRN Gwen Edler, Harrold Donath, MD       lurasidone (LATUDA) tablet 40 mg  40 mg Oral Q breakfast Carlyn Reichert, MD   40 mg at 02/22/22 0759   magnesium hydroxide (MILK OF MAGNESIA) suspension 30 mL  30 mL Oral Daily PRN Onuoha, Chinwendu V, NP       nicotine polacrilex (NICORETTE) gum 2 mg  2 mg Oral PRN Cerita Rabelo, Harrold Donath, MD       traZODone (DESYREL) tablet 50 mg  50 mg Oral QHS PRN Felicidad Sugarman, MD        Lab Results: No results found for this or any previous visit (from the past 48 hour(s)).  Blood Alcohol level:  Lab Results  Component Value Date   ETH <10 04/06/2021   ETH <10 11/20/2016    Metabolic Disorder Labs: Lab Results  Component Value Date   HGBA1C 5.0 04/06/2021   MPG 96.8 04/06/2021   MPG 105.41 11/21/2016   No results found for: "PROLACTIN" Lab Results  Component Value Date   CHOL 151 02/18/2022   TRIG 93 02/18/2022   HDL 57 02/18/2022   CHOLHDL 2.6 02/18/2022   VLDL 19 02/18/2022   LDLCALC 75 02/18/2022   LDLCALC 84 04/06/2021    Physical Findings: AIMS:  , ,  ,  ,    CIWA:    COWS:     Musculoskeletal: Strength & Muscle Tone: within normal limits Gait & Station: normal Patient leans: N/A  Psychiatric Specialty Exam:  Presentation  General Appearance:  Casual; Fairly Groomed  Eye Contact: Good  Speech: Clear and Coherent; Normal Rate  Speech Volume: Normal  Handedness: Right   Mood and Affect   Mood: Euthymic  Affect: Appropriate; Congruent; Full Range   Thought Process  Thought Processes: Linear  Descriptions of Associations:Intact  Orientation:Full (Time, Place and Person)  Thought Content:Logical  History of Schizophrenia/Schizoaffective disorder:No  Duration of Psychotic Symptoms:N/A  Hallucinations:No data recorded Ideas of Reference:None  Suicidal Thoughts:Suicidal Thoughts: No  Homicidal Thoughts:Homicidal Thoughts: No   Sensorium  Memory: Immediate Good; Recent Good; Remote Good  Judgment: Fair  Insight: Fair   Art therapist  Concentration: Fair  Attention Span: Fair  Recall: Good  Fund of Knowledge:  Good  Language: Good   Psychomotor Activity  Psychomotor Activity: Psychomotor Activity: Normal   Assets  Assets: Communication Skills; Physical Health; Resilience; Housing; Transportation   Sleep  Sleep: Sleep: Fair    Physical Exam: Physical Exam Vitals reviewed.  Constitutional:      Appearance: She is normal weight.  Neurological:     Mental Status: She is alert.     Motor: No weakness.     Gait: Gait normal.    Review of Systems  Psychiatric/Behavioral:  Positive for substance abuse. Negative for depression, hallucinations and suicidal ideas. The patient is nervous/anxious. The patient does not have insomnia.    Blood pressure 121/78, pulse 82, temperature (!) 96.8 F (36 C), temperature source Oral, resp. rate 16, height 5\' 5"  (1.651 m), SpO2 100 %. Body mass index is 33.12 kg/m.   Treatment Plan Summary: Daily contact with patient to assess and evaluate symptoms and progress in treatment and Medication management    ASSESSMENT:   Diagnoses / Active Problems: -Bipolar disorder, type I, current episode is depressed -GAD -PTSD -Cannabis abuse -Tobacco use   PLAN: Safety and Monitoring:             --  Voluntary admission to inpatient psychiatric unit for safety, stabilization and  treatment             -- Daily contact with patient to assess and evaluate symptoms and progress in treatment             -- Patient's case to be discussed in multi-disciplinary team meeting             -- Observation Level : q15 minute checks             -- Vital signs:  q12 hours             -- Precautions: suicide, elopement, and assault   2. Psychiatric Diagnoses and Treatment:  - Continue Latuda 40 mg daily with food for bipolar depression - Counseled patient on need to take this medication with a meal each day - Can consider adding antidepressant --  The risks/benefits/side-effects/alternatives to this medication were discussed in detail with the patient and time was given for questions. The patient consents to medication trial.                -- Metabolic profile and EKG monitoring obtained while on an atypical antipsychotic (BMI: Lipid Panel: HbgA1c: QTc:)              -- Encouraged patient to participate in unit milieu and in scheduled group therapies                          3. Medical Issues Being Addressed:              Tobacco Use Disorder             -- Nicotine  gum prn              -- Smoking cessation encouraged   4. Discharge Planning:              -- Social work and case management to assist with discharge planning and identification of hospital follow-up needs prior to discharge             -- Estimated LOS: 5-7 days             -- Discharge Concerns: Need to establish a safety  plan; Medication compliance and effectiveness             -- Discharge Goals: Return home with outpatient referrals for mental health follow-up including medication management/psychotherapy             -- Patient requesting discharge on Monday             - Unable to call and receive reassuring collateral and perform safety planning - Patient may benefit from [facility] program.  She reports a bad experience with a counselor at [facility].  May benefit from [facility] given that she has Sports coach.      Christoper Allegra, MD 02/22/2022, 3:16 PM  Total Time Spent in Direct Patient Care:  I personally spent 40 minutes on the unit in direct patient care. The direct patient care time included face-to-face time with the patient, reviewing the patient's chart, communicating with other professionals, and coordinating care. Greater than 50% of this time was spent in counseling or coordinating care with the patient regarding goals of hospitalization, psycho-education, and discharge planning needs.   Janine Limbo, MD Psychiatrist

## 2022-02-22 NOTE — [facility] Group Notes (Signed)
Pt attended [place] group

## 2022-02-22 NOTE — Progress Notes (Signed)
   02/22/22 2000  Psychosocial Assessment  Patient Complaints Depression;Anxiety (Currently rates depression a 4# and Anxiety a2 # on 1-10 # scale with 10# being the worse)  Eye Contact Fair  Facial Expression Anxious  Affect Anxious;Depressed  Speech Logical/coherent  Interaction Assertive  Motor Activity Other (Comment) (WNL)  Behavior Characteristics Cooperative  Mood Depressed;Anxious;Pleasant  Thought Process  Coherency WDL  Content WDL  Delusions None reported or observed  Perception WDL  Hallucination None reported or observed  Judgment Limited  Confusion None  Danger to Self  Current suicidal ideation? Denies   Alisha Washington is very pleasant. She denies S.I. but admits to some continued depression and rates it a 4#. She has not completed her safety plan for discharge and is educated on doing so. She verbalizes understanding. Alisha Washington appears anxious but rates it 2 # on 1-10 # scale with 10# being the worse.

## 2022-02-22 NOTE — Group Note (Signed)
Date:  02/22/2022 Time:  10:48 AM  Group Topic/Focus:  Orientation:   The focus of this group is to educate the patient on the purpose and policies of crisis stabilization and provide a format to answer questions about their admission.  The group details unit policies and expectations of patients while admitted.    Participation Level:  Active  Participation Quality:  Appropriate  Affect:  Appropriate  Cognitive:  Appropriate  Insight: Appropriate  Engagement in Group:  Engaged  Modes of Intervention:  Discussion  Additional Comments:     Jerrye Beavers 02/22/2022, 10:48 AM

## 2022-02-22 NOTE — Progress Notes (Signed)
   02/22/22 1000  Psych Admission Type (Psych Patients Only)  Admission Status Voluntary  Psychosocial Assessment  Patient Complaints None  Eye Contact Brief  Facial Expression Animated  Affect Appropriate to circumstance  Speech Logical/coherent  Interaction Assertive  Motor Activity Other (Comment) (wdl)  Appearance/Hygiene Unremarkable  Behavior Characteristics Appropriate to situation  Mood Pleasant  Thought Process  Coherency WDL  Content WDL  Delusions None reported or observed  Perception WDL  Hallucination None reported or observed  Judgment WDL  Confusion None  Danger to Self  Current suicidal ideation? Denies  Danger to Others  Danger to Others None reported or observed

## 2022-02-22 NOTE — Group Note (Signed)
Recreation Therapy Group Note   Group Topic:Team Building  Group Date: 02/22/2022 Start Time: 0936 End Time: 0951 Facilitators: Anastacio Bua-McCall, LRT,CTRS Location: [address] Day[address]   Goal Area(s) Addresses:  Patient will effectively work with peer towards shared goal.  Patient will identify skills used to make activity successful.  Patient will identify how skills used during activity can be applied to reach post d/c goals.    Group Description: [employer]. In teams of 5-6, patients were given 11 craft pipe cleaners. Using the materials provided, patients were instructed to compete again the opposing team(s) to build the tallest free-standing structure from floor level. The activity was timed; difficulty increased by Probation officer as Pharmacist, hospital continued.  Systematically resources were removed with additional directions for example, placing one arm behind their back, working in silence, and shape stipulations. LRT facilitated post-activity discussion reviewing team processes and necessary communication skills involved in completion. Patients were encouraged to reflect how the skills utilized, or not utilized, in this activity can be incorporated to positively impact support systems post discharge.   Affect/Mood: Appropriate   Participation Level: Engaged   Participation Quality: Independent   Behavior: Appropriate   Speech/Thought Process: Focused   Insight: Good   Judgement: Good   Modes of Intervention: STEM Activity   Patient Response to Interventions:  Engaged   Education Outcome:  Acknowledges education and In group clarification offered    Clinical Observations/Individualized Feedback: Pt was attentive and engaged with peers.  Pt help strategies on how to build the tower.  Pt also expressed how you have to talk to your support system so they know what you are dealing with.      Plan: Continue to engage patient in RT group sessions  2-3x/week.   Jashad Depaula-McCall, LRT,CTRS 02/22/2022 12:59 PM

## 2022-02-23 DIAGNOSIS — F332 Major depressive disorder, recurrent severe without psychotic features: Secondary | ICD-10-CM

## 2022-02-23 MED ORDER — LURASIDONE HCL 40 MG PO TABS: 40.0000 mg | ORAL_TABLET | Freq: Every day | ORAL | 0 refills | Status: DC

## 2022-02-23 MED ORDER — NICOTINE POLACRILEX 2 MG MT GUM: 2.0000 mg | CHEWING_GUM | OROMUCOSAL | 0 refills | Status: DC | PRN

## 2022-02-23 NOTE — Discharge Summary (Signed)
Physician Discharge Summary Note  Patient:  Alisha [place] is an 27 y.o., female MRN:  [id number] DOB:  01/15/1996 Patient phone:  [phone] (home)  Patient address:   [place] [address],  Total Time spent with patient: 15 minutes  Date of Admission:  02/18/2022 Date of Discharge: 02-23-2021  Reason for Admission:   Patient is a 27 year old female with a reported psychiatric history of major depressive disorder versus bipolar disorder, GAD, cannabis use who was admitted to this hospital from the [facility] for evaluation of worsening suicidal thoughts with plan to overdose, and worsening depression.     Principal Problem: Bipolar 1 disorder, depressed, severe ([place]) Discharge Diagnoses: Principal Problem:   Bipolar 1 disorder, depressed, severe ([place]) Active Problems:   GAD (generalized anxiety disorder)   PTSD (post-traumatic stress disorder)   Cannabis abuse   Past Psychiatric History:  MDD versus bipolar disorder, GAD.  Most recently receiving outpatient psychiatric care at [facility].  This is her third psychiatric hospitalization.  She reports a history of self-injurious behavior by cutting.  Patient reports taking no outpatient psychiatric medications leading up to this admission.  Patient reports past psychiatric medication history including Abilify, Zoloft, Lexapro, Effexor, Prozac.   Past Medical History:  Past Medical History:  Diagnosis Date   Deliberate self-cutting    Panic attacks    Scoliosis    Seasonal allergies    History reviewed. No pertinent surgical history. Family History:  Family History  Problem Relation Age of Onset   Diabetes Father    Family Psychiatric  History:  Patient reports mom recently told her she was diagnosed with something but the patient does not recall. Reports her uncle committed suicide     Social History:  Social History   Substance and Sexual Activity  Alcohol Use No     Social History   Substance and  Sexual Activity  Drug Use Yes   Types: Marijuana    Social History   Socioeconomic History   Marital status: Single    Spouse name: Not on file   Number of children: Not on file   Years of education: Not on file   Highest education level: Not on file  Occupational History   Not on file  Tobacco Use   Smoking status: Some Days    Types: Cigars   Smokeless tobacco: Never  Vaping Use   Vaping Use: Every day   Substances: Nicotine  Substance and Sexual Activity   Alcohol use: No   Drug use: Yes    Types: Marijuana   Sexual activity: Yes    Birth control/protection: Condom  Other Topics Concern   Not on file  Social History Narrative   Not on file   Social Determinants of Health   Financial Resource Strain: Not on file  Food Insecurity: No Food Insecurity (02/18/2022)   Hunger Vital Sign    Worried About Running Out of Food in the Last Year: Never true    Ran Out of Food in the Last Year: Never true  Transportation Needs: No Transportation Needs (02/18/2022)   PRAPARE - Hydrologist (Medical): No    Lack of Transportation (Non-Medical): No  Physical Activity: Not on file  Stress: Not on file  Social Connections: Not on file    Hospital Course:    During the patient's hospitalization, patient had extensive initial psychiatric evaluation, and follow-up psychiatric evaluations every day.   Psychiatric diagnoses provided upon initial assessment:  -Bipolar disorder,  type I, current episode is depressed -GAD -PTSD -Cannabis abuse -Tobacco use   Patient's psychiatric medications were adjusted on admission:  -Start Latuda 20 mg daily with dinner for bipolar disorder.    During the hospitalization, other adjustments were made to the patient's psychiatric medication regimen: Incr latuda to 40 mg once daily with food    Patient's care was discussed during the interdisciplinary team meeting every day during the hospitalization.   The patient  denies having side effects to prescribed psychiatric medication.   Gradually, patient started adjusting to milieu. The patient was evaluated each day by a clinical provider to ascertain response to treatment. Improvement was noted by the patient's report of decreasing symptoms, improved sleep and appetite, affect, medication tolerance, behavior, and participation in unit programming.  Patient was asked each day to complete a self inventory noting mood, mental status, pain, new symptoms, anxiety and concerns.     Symptoms were reported as significantly decreased or resolved completely by discharge.    On day of discharge, the patient reports that their mood is stable. The patient denied having suicidal thoughts for more than 48 hours prior to discharge.  Patient denies having homicidal thoughts.  Patient denies having auditory hallucinations.  Patient denies any visual hallucinations or other symptoms of psychosis. The patient was motivated to continue taking medication with a goal of continued improvement in mental health.    The patient reports their target psychiatric symptoms of depression and suicidal thoughts, all responded well to the psychiatric medications, and the patient reports overall benefit other psychiatric hospitalization. Supportive psychotherapy was provided to the patient. The patient also participated in regular group therapy while hospitalized. Coping skills, problem solving as well as relaxation therapies were also part of the unit programming.   Labs were reviewed with the patient, and abnormal results were discussed with the patient.   The patient is able to verbalize their individual safety plan to this provider.   # It is recommended to the patient to continue psychiatric medications as prescribed, after discharge from the hospital.     # It is recommended to the patient to follow up with your outpatient psychiatric provider and PCP.   # It was discussed with the patient,  the impact of alcohol, drugs, tobacco have been there overall psychiatric and medical wellbeing, and total abstinence from substance use was recommended the patient.ed.   # Prescriptions provided or sent directly to preferred pharmacy at discharge. Patient agreeable to plan. Given opportunity to ask questions. Appears to feel comfortable with discharge.    # In the event of worsening symptoms, the patient is instructed to call the crisis hotline, 911 and or go to the nearest ED for appropriate evaluation and treatment of symptoms. To follow-up with primary care provider for other medical issues, concerns and or health care needs   # Patient was discharged home, with a plan to follow up as noted below.          Physical Findings: AIMS:  , ,  ,  ,    CIWA:    COWS:     Musculoskeletal: Strength & Muscle Tone: within normal limits Gait & Station: normal Patient leans: N/A  Aims score zero on my exam. No eps on my exam.   Psychiatric Specialty Exam:  Presentation  General Appearance:  Casual  Eye Contact: Good  Speech: Clear and Coherent; Normal Rate  Speech Volume: Normal  Handedness: Right   Mood and Affect  Mood: Euthymic  Affect:  Appropriate; Congruent; Full Range   Thought Process  Thought Processes: Linear  Descriptions of Associations:Intact  Orientation:Full (Time, Place and Person)  Thought Content:Logical  History of Schizophrenia/Schizoaffective disorder:No  Duration of Psychotic Symptoms:N/A  Hallucinations:Hallucinations: None  Ideas of Reference:None  Suicidal Thoughts:Suicidal Thoughts: No  Homicidal Thoughts:Homicidal Thoughts: No   Sensorium  Memory: Immediate Good; Recent Good; Remote Good  Judgment: Fair  Insight: Fair   Art therapist  Concentration: Fair  Attention Span: Fair  Recall: Good  Fund of Knowledge: Good  Language: Good   Psychomotor Activity  Psychomotor Activity: Psychomotor  Activity: Normal   Assets  Assets: Communication Skills; Physical Health; Resilience; Housing; Transportation   Sleep  Sleep: Sleep: Fair    Physical Exam: Physical Exam Vitals reviewed.  Constitutional:      General: She is not in acute distress.    Appearance: She is normal weight. She is not toxic-appearing.  Neurological:     Mental Status: She is alert.     Motor: No weakness.     Gait: Gait normal.    Review of Systems  Constitutional:  Negative for chills and fever.  Cardiovascular:  Negative for chest pain and palpitations.  Neurological:  Negative for dizziness, tingling, tremors and headaches.  Psychiatric/Behavioral:  Positive for substance abuse. Negative for depression, hallucinations, memory loss and suicidal ideas. The patient is not nervous/anxious and does not have insomnia.   All other systems reviewed and are negative.  Blood pressure 122/81, pulse 92, temperature 98.4 F (36.9 C), temperature source Oral, resp. rate 16, height 5\' 5"  (1.651 m), SpO2 100 %. Body mass index is 33.12 kg/m.   Social History   Tobacco Use  Smoking Status Some Days   Types: Cigars  Smokeless Tobacco Never   Tobacco Cessation:  A prescription for an FDA-approved tobacco cessation medication provided at discharge   Blood Alcohol level:  Lab Results  Component Value Date   [facility] <10 04/06/2021   ETH <10 11/20/2016    Metabolic Disorder Labs:  Lab Results  Component Value Date   HGBA1C 5.0 04/06/2021   MPG 96.8 04/06/2021   MPG 105.41 11/21/2016   No results found for: "PROLACTIN" Lab Results  Component Value Date   CHOL 151 02/18/2022   TRIG 93 02/18/2022   HDL 57 02/18/2022   CHOLHDL 2.6 02/18/2022   VLDL 19 02/18/2022   LDLCALC 75 02/18/2022   LDLCALC 84 04/06/2021    See Psychiatric Specialty Exam and Suicide Risk Assessment completed by Attending Physician prior to discharge.  Discharge destination:  Home  Is patient on multiple antipsychotic  therapies at discharge:  No   Has Patient had three or more failed trials of antipsychotic monotherapy by history:  No  Recommended Plan for Multiple Antipsychotic Therapies: NA  Discharge Instructions     Diet - low sodium heart healthy   Complete by: As directed    Increase activity slowly   Complete by: As directed       Allergies as of 02/23/2022   No Known Allergies      Medication List     TAKE these medications      Indication  lurasidone 40 MG Tabs tablet Commonly known as: LATUDA Take 1 tablet (40 mg total) by mouth daily with breakfast. Start taking on: February 24, 2022  Indication: Depressive Phase of Manic-Depression   nicotine polacrilex 2 MG gum Commonly known as: NICORETTE Take 1 each (2 mg total) by mouth as needed for smoking cessation.  Indication:  Nicotine Addiction        Follow-up Information     [facility] Follow up on 03/24/2022.   Specialty: Behavioral Health Why: You have an appointment for medication management services on 03/24/21 at 9:00 am with Dr. Gilmore Laroche. You also have an appointment for therapy services on  03/25/22 at 9:00 am with [facility].   The appointments will be held Virtually, via MyChart. Contact information: [address] [place] [address] [phone]                Follow-up recommendations:     Activity: as tolerated   Diet: heart healthy   Other: -Follow-up with your outpatient psychiatric provider -instructions on appointment date, time, and address (location) are provided to you in discharge paperwork.   -Take your psychiatric medications as prescribed at discharge - instructions are provided to you in the discharge paperwork   -Follow-up with outpatient primary care doctor and other specialists -for management of preventative medicine and chronic medical disease.   -Recommend abstinence from alcohol, tobacco, and other illicit drug use at  discharge.    -If your psychiatric symptoms recur, worsen, or if you have side effects to your psychiatric medications, call your outpatient psychiatric provider, 911, [phone] or go to the nearest emergency department.   -If suicidal thoughts recur, call your outpatient psychiatric provider, 911, [phone] or go to the nearest emergency department.       Signed: Cristy Hilts, MD 02/23/2022, 8:33 AM   Total Time Spent in Direct Patient Care:  I personally spent 35 minutes on the unit in direct patient care. The direct patient care time included face-to-face time with the patient, reviewing the patient's chart, communicating with other professionals, and coordinating care. Greater than 50% of this time was spent in counseling or coordinating care with the patient regarding goals of hospitalization, psycho-education, and discharge planning needs.   Phineas Inches, MD Psychiatrist
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
## 2022-02-23 NOTE — Discharge Instructions (Signed)
-  Follow-up with your outpatient psychiatric provider -instructions on appointment date, time, and address (location) are provided to you in discharge paperwork.  -Take your psychiatric medications as prescribed at discharge - instructions are provided to you in the discharge paperwork  -Follow-up with outpatient primary care doctor and other specialists -for management of preventative medicine and any chronic medical disease.  -Recommend abstinence from alcohol, tobacco, and other illicit drug use at discharge.   -If your psychiatric symptoms recur, worsen, or if you have side effects to your psychiatric medications, call your outpatient psychiatric provider, 911, 988 or go to the nearest emergency department.  -If suicidal thoughts occur, call your outpatient psychiatric provider, 911, 988 or go to the nearest emergency department.  Naloxone (Narcan) can help reverse an overdose when given to the victim quickly.  [place] offers free naloxone kits and instructions/training on its use.  Add naloxone to your first aid kit and you can help save a life.   Pick up your free kit at the following locations:   [place]:  [facility], [address] [place] [address] ([phone]) [facility] [address] [place] [id number] ([phone]) [facility] [address] [place]  [address]  [place]: [facility] [address] [place] [address] ([phone]) [facility] [address] [place]  [address] ([phone])  

## 2022-02-23 NOTE — Progress Notes (Signed)
  [facility] Adult Case Management Discharge Plan :  Will you be returning to the same living situation after discharge:  Yes,  Home  At discharge, do you have transportation home?: Yes,  Bus Ticket  Do you have the ability to pay for your medications: Yes,  [employer]   Release of information consent forms completed and in the chart;  Patient's signature needed at discharge.  Patient to Follow up at:  Follow-up [place] Follow up on 03/24/2022.   Specialty: Behavioral Health Why: You have an appointment for medication management services on 03/24/21 at 9:00 am with Dr. De Nurse. You also have an appointment for therapy services on  03/25/22 at 9:00 am with [facility].   The appointments will be held Virtually, via [place]. Contact information: [place]  [address] [place] [phone]                Next level of care provider has access to [facility] and Suicide Prevention discussed: Yes,  with patient and roommate      Has patient been referred to the Quitline?: Patient refused referral  Patient has been referred for addiction treatment: Pt. refused referral  Darleen Crocker, [place] 02/23/2022, 9:57 AM

## 2022-02-23 NOTE — Plan of Care (Signed)
  Problem: Education: Goal: Knowledge of [facility] Education information/materials will improve Outcome: Adequate for Discharge Goal: Emotional status will improve Outcome: Adequate for Discharge Goal: Mental status will improve Outcome: Adequate for Discharge Goal: Verbalization of understanding the information provided will improve Outcome: Adequate for Discharge   Problem: Activity: Goal: Interest or engagement in activities will improve Outcome: Adequate for Discharge Goal: Sleeping patterns will improve Outcome: Adequate for Discharge   Problem: Coping: Goal: Ability to verbalize frustrations and anger appropriately will improve Outcome: Adequate for Discharge Goal: Ability to demonstrate self-control will improve Outcome: Adequate for Discharge   Problem: Health Behavior/Discharge Planning: Goal: Identification of resources available to assist in meeting health care needs will improve Outcome: Adequate for Discharge Goal: Compliance with treatment plan for underlying cause of condition will improve Outcome: Adequate for Discharge   Problem: Physical Regulation: Goal: Ability to maintain clinical measurements within normal limits will improve Outcome: Adequate for Discharge   Problem: Safety: Goal: Periods of time without injury will increase Outcome: Adequate for Discharge   Problem: Education: Goal: Ability to make informed decisions regarding treatment will improve Outcome: Adequate for Discharge   Problem: Coping: Goal: Coping ability will improve Outcome: Adequate for Discharge   Problem: Health Behavior/Discharge Planning: Goal: Identification of resources available to assist in meeting health care needs will improve Outcome: Adequate for Discharge   Problem: Medication: Goal: Compliance with prescribed medication regimen will improve Outcome: Adequate for Discharge   Problem: Self-Concept: Goal: Ability to disclose and discuss suicidal ideas  will improve Outcome: Adequate for Discharge Goal: Will verbalize positive feelings about self Outcome: Adequate for Discharge   Problem: Education: Goal: Ability to state activities that reduce stress will improve Outcome: Adequate for Discharge   Problem: Coping: Goal: Ability to identify and develop effective coping behavior will improve Outcome: Adequate for Discharge   Problem: Self-Concept: Goal: Ability to identify factors that promote anxiety will improve Outcome: Adequate for Discharge Goal: Level of anxiety will decrease Outcome: Adequate for Discharge Goal: Ability to modify response to factors that promote anxiety will improve Outcome: Adequate for Discharge

## 2022-02-23 NOTE — Progress Notes (Signed)
D: Patient verbalizes readiness for discharge, denies suicidal and homicidal ideations, denies auditory and visual hallucinations.  No complaints of pain. Suicide Safety Plan completed and copy placed in the chart.  A:  Patient receptive to discharge instructions. Questions encouraged, both verbalize understanding.  R:  Escorted to the lobby by this RN.  

## 2022-03-24 ENCOUNTER — Telehealth (HOSPITAL_COMMUNITY): Payer: 59 | Admitting: Psychiatry

## 2022-03-24 ENCOUNTER — Encounter (HOSPITAL_COMMUNITY): Payer: Self-pay

## 2022-03-25 ENCOUNTER — Ambulatory Visit (INDEPENDENT_AMBULATORY_CARE_PROVIDER_SITE_OTHER): Payer: 59 | Admitting: Licensed Clinical Social Worker

## 2022-03-25 DIAGNOSIS — F1210 Cannabis abuse, uncomplicated: Secondary | ICD-10-CM | POA: Diagnosis not present

## 2022-03-25 DIAGNOSIS — F0630 Mood disorder due to known physiological condition, unspecified: Secondary | ICD-10-CM

## 2022-03-25 DIAGNOSIS — F4310 Post-traumatic stress disorder, unspecified: Secondary | ICD-10-CM | POA: Diagnosis not present

## 2022-03-25 DIAGNOSIS — F411 Generalized anxiety disorder: Secondary | ICD-10-CM

## 2022-03-25 DIAGNOSIS — R69 Illness, unspecified: Secondary | ICD-10-CM | POA: Diagnosis not present

## 2022-03-25 DIAGNOSIS — F331 Major depressive disorder, recurrent, moderate: Secondary | ICD-10-CM | POA: Diagnosis not present
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
## 2022-04-13 ENCOUNTER — Encounter (HOSPITAL_COMMUNITY): Payer: Self-pay

## 2022-04-13 ENCOUNTER — Ambulatory Visit (INDEPENDENT_AMBULATORY_CARE_PROVIDER_SITE_OTHER): Payer: 59 | Admitting: Licensed Clinical Social Worker

## 2022-04-13 DIAGNOSIS — F411 Generalized anxiety disorder: Secondary | ICD-10-CM | POA: Diagnosis not present

## 2022-04-13 DIAGNOSIS — F1210 Cannabis abuse, uncomplicated: Secondary | ICD-10-CM | POA: Diagnosis not present

## 2022-04-13 DIAGNOSIS — F4310 Post-traumatic stress disorder, unspecified: Secondary | ICD-10-CM | POA: Diagnosis not present

## 2022-04-13 DIAGNOSIS — F0630 Mood disorder due to known physiological condition, unspecified: Secondary | ICD-10-CM | POA: Diagnosis not present

## 2022-04-13 DIAGNOSIS — F331 Major depressive disorder, recurrent, moderate: Secondary | ICD-10-CM

## 2022-04-13 NOTE — Progress Notes (Signed)
Virtual Visit via Video Note  I connected with Alisha Washington on 04/13/22 at  8:00 AM EST by a video enabled telemedicine application and verified that I am speaking with the correct person using two identifiers.  Location: Patient: home Provider: office   I discussed the limitations of evaluation and management by telemedicine and the availability of in person appointments. The patient expressed understanding and agreed to proceed.   I discussed the assessment and treatment plan with the patient. The patient was provided an opportunity to ask questions and all were answered. The patient agreed with the plan and demonstrated an understanding of the instructions.   The patient was advised to call back or seek an in-person evaluation if the symptoms worsen or if the condition fails to improve as anticipated.  I provided 30 minutes of non-face-to-face time during this encounter.  THERAPIST PROGRESS NOTE  Session Time: 8:00 AM to 8:30 AM  Participation Level: Active  Behavioral Response: CasualAlertAnxious  Type of Therapy: Individual Therapy  Treatment Goals addressed: Anxiety, depression, coping  ProgressTowards Goals: Initial  Interventions: Solution Focused, Strength-based, Supportive, and Other: Worked on building therapeutic relationship  Summary: Alisha Washington is a 27 y.o. female who presents with therapist asking how doing patient says fine a little better, not much going on. Works at a hotel fine it pays. Not as sad as usual. Still has meds but plans to call doctor on Thursday to see psychiatrist in our office.Therapist noted effectiveness of medications when people are on bipolar spectrum. Patient lives with three roommates. Last minute thing find something quickly. Doesn't really talk but no issues.  Therapist wanted to get to know patient better so explored questions asked her about hobbies. Doesn't do a lot comes home and watches something. Likes random old time television and  also [place]. Likes [employer]. Don't read as much can focus.  Therapist noted patient getting a little upset and patient explains anxious wants to keep short doesn't like talking about herself.  We reviewed treatment plan patient gave consent to complete virtually explored what main goal would be patient unsure could not identify any symptoms.  Therapist noted approaching it from her different diagnoses managing symptoms as needed as well as using therapy to work on goals identified as we go along.  Patient has not done therapy before seemed uncomfortable hopefully she gets more comfortable with the format.  Suicidal/Homicidal: No  Plan: Return again in 3 weeks.2.  Work on building therapeutic relationship use thoughts and feelings workbook to introduce basic CBT concepts 3.  Patient call office to set up appointment with psychiatrist  Diagnosis: Mood disorder in conditions classified elsewhere, major depressive disorder recurrent moderate, generalized anxiety disorder, PTSD, cannabis abuse  Collaboration of Care: Other none needed  Patient/Guardian was advised Release of Information must be obtained prior to any record release in order to collaborate their care with an outside provider. Patient/Guardian was advised if they have not already done so to contact the registration department to sign all necessary forms in order for [place] to release information regarding their care.   Consent: Patient/Guardian gives verbal consent for treatment and assignment of benefits for services provided during this visit. Patient/Guardian expressed understanding and agreed to proceed.   Cordella Register, LCSW 04/13/2022

## 2022-05-10 ENCOUNTER — Ambulatory Visit (INDEPENDENT_AMBULATORY_CARE_PROVIDER_SITE_OTHER): Payer: Self-pay | Admitting: Licensed Clinical Social Worker

## 2022-05-10 ENCOUNTER — Encounter (HOSPITAL_COMMUNITY): Payer: Self-pay

## 2022-05-10 DIAGNOSIS — F0630 Mood disorder due to known physiological condition, unspecified: Secondary | ICD-10-CM

## 2022-05-10 DIAGNOSIS — F1210 Cannabis abuse, uncomplicated: Secondary | ICD-10-CM

## 2022-05-10 DIAGNOSIS — F4310 Post-traumatic stress disorder, unspecified: Secondary | ICD-10-CM

## 2022-05-10 DIAGNOSIS — F331 Major depressive disorder, recurrent, moderate: Secondary | ICD-10-CM

## 2022-05-10 DIAGNOSIS — F411 Generalized anxiety disorder: Secondary | ICD-10-CM

## 2022-05-10 NOTE — Progress Notes (Signed)
Therapist contacted patient through My Chart and she did not respond 

## 2022-08-14 ENCOUNTER — Encounter (HOSPITAL_COMMUNITY): Payer: Self-pay

## 2022-08-14 ENCOUNTER — Inpatient Hospital Stay (HOSPITAL_COMMUNITY)
Admission: AD | Admit: 2022-08-14 | Discharge: 2022-08-14 | Disposition: A | Discharge status: Home / Self Care | Payer: Medicaid Other | Attending: Obstetrics and Gynecology | Admitting: Obstetrics and Gynecology

## 2022-08-14 ENCOUNTER — Inpatient Hospital Stay (HOSPITAL_COMMUNITY): Payer: Medicaid Other

## 2022-08-14 DIAGNOSIS — O034 Incomplete spontaneous abortion without complication: Secondary | ICD-10-CM | POA: Insufficient documentation

## 2022-08-14 DIAGNOSIS — O209 Hemorrhage in early pregnancy, unspecified: Secondary | ICD-10-CM | POA: Diagnosis present

## 2022-08-14 DIAGNOSIS — N939 Abnormal uterine and vaginal bleeding, unspecified: Secondary | ICD-10-CM | POA: Diagnosis not present

## 2022-08-14 DIAGNOSIS — Z3A09 9 weeks gestation of pregnancy: Secondary | ICD-10-CM | POA: Diagnosis not present

## 2022-08-14 DIAGNOSIS — R1030 Lower abdominal pain, unspecified: Secondary | ICD-10-CM | POA: Insufficient documentation

## 2022-08-14 LAB — ABO/RH: ABO/RH(D): A POS

## 2022-08-14 LAB — CBC
HCT: 39.8 % (ref 36.0–46.0)
Hemoglobin: 13.4 g/dL (ref 12.0–15.0)
MCH: 28.6 pg (ref 26.0–34.0)
MCHC: 33.7 g/dL (ref 30.0–36.0)
MCV: 85 fL (ref 80.0–100.0)
Platelets: 320 10*3/uL (ref 150–400)
RBC: 4.68 MIL/uL (ref 3.87–5.11)
RDW: 13.2 % (ref 11.5–15.5)
WBC: 8.7 10*3/uL (ref 4.0–10.5)
nRBC: 0 % (ref 0.0–0.2)

## 2022-08-14 LAB — HCG, QUANTITATIVE, PREGNANCY: hCG, Beta Chain, Quant, S: 7505 m[IU]/mL — ABNORMAL HIGH (ref <5)

## 2022-08-14 LAB — POCT PREGNANCY, URINE
Preg Test, Ur: POSITIVE — AB
Preg Test, Ur: POSITIVE — AB

## 2022-08-14 MED ORDER — OXYCODONE-ACETAMINOPHEN 5-325 MG PO TABS: 1.0000 | ORAL_TABLET | Freq: Four times a day (QID) | ORAL | 0 refills | Status: DC | PRN

## 2022-08-14 MED ORDER — PROMETHAZINE HCL 25 MG PO TABS: 25.0000 mg | ORAL_TABLET | Freq: Four times a day (QID) | ORAL | 0 refills | Status: DC | PRN

## 2022-08-14 MED ORDER — ONDANSETRON 4 MG PO TBDP: 4.0000 mg | ORAL_TABLET | Freq: Three times a day (TID) | ORAL | 0 refills | Status: DC | PRN

## 2022-08-14 MED ORDER — MISOPROSTOL 200 MCG PO TABS: ORAL_TABLET | ORAL | 1 refills | Status: DC

## 2022-08-14 MED ORDER — IBUPROFEN 600 MG PO TABS: 600.0000 mg | ORAL_TABLET | Freq: Four times a day (QID) | ORAL | 3 refills | Status: DC | PRN

## 2022-08-14 NOTE — MAU Provider Note (Signed)
History     CSN: [id number]  Arrival date and time: 08/14/22 1415   None     Chief Complaint  Patient presents with   Vaginal Bleeding   Abdominal Pain   HPI Alisha Washington is a 27 y.o. G1P0 at [redacted]w[redacted]d by LMP who presents to MAU for vaginal bleeding and lower abdominal cramping. She reports spotting started a few days ago and today it became heavier. She reports blood soaked through her clothes. She reports passing several golf ball sized blood clots. She has been having lower abdominal cramping since she found out that she was pregnant however it has worsened over the past few days. She has not taken anything to relieve it. She denies urinary s/s, vaginal itching, or odor. She denies fever or chills. LMP was 06/10/2022.  OB History     Gravida  1   Para      Term      Preterm      AB      Living         SAB      IAB      Ectopic      Multiple      Live Births              Past Medical History:  Diagnosis Date   Deliberate self-cutting    Panic attacks    Scoliosis    Seasonal allergies     No past surgical history on file.  Family History  Problem Relation Age of Onset   Diabetes Father     Social History   Tobacco Use   Smoking status: Some Days    Types: Cigars   Smokeless tobacco: Never  Vaping Use   Vaping Use: Every day   Substances: Nicotine  Substance Use Topics   Alcohol use: No   Drug use: Yes    Types: Marijuana    Allergies: No Known Allergies  No medications prior to admission.   Review of Systems  Gastrointestinal:  Positive for abdominal pain.  Genitourinary:  Positive for vaginal bleeding.  All other systems reviewed and are negative.  Physical Exam   Blood pressure 132/79, pulse 77, temperature 97.7 F (36.5 C), temperature source Oral, resp. rate 18, height 5\' 5"  (1.651 m), weight 77 kg, last menstrual period 06/10/2022, SpO2 99 %.  Physical Exam Vitals and nursing note reviewed. Exam conducted with a chaperone  present.  Constitutional:      General: She is not in acute distress. Cardiovascular:     Rate and Rhythm: Tachycardia present.  Pulmonary:     Effort: Pulmonary effort is normal. No respiratory distress.  Abdominal:     Palpations: Abdomen is soft.     Tenderness: There is no abdominal tenderness.  Genitourinary:    Comments: Normal external female genitalia, vaginal walls pink and well-rugated, moderate amount of dark red blood, POC's teased out with ring forceps and bleeding stopped, cervix visually 1 cm without lesions/masses Skin:    General: Skin is warm and dry.  Neurological:     General: No focal deficit present.     Mental Status: She is alert and oriented to person, place, and time.  Psychiatric:        Mood and Affect: Mood normal.        Behavior: Behavior normal.    Results for orders placed or performed during the hospital encounter of 08/14/22 (from the past 24 hour(s))  Pregnancy, urine POC  Status: Abnormal   Collection Time: 08/14/22  2:27 PM  Result Value Ref Range   Preg Test, Ur POSITIVE (A) NEGATIVE  CBC     Status: None   Collection Time: 08/14/22  2:58 PM  Result Value Ref Range   WBC 8.7 4.0 - 10.5 K/uL   RBC 4.68 3.87 - 5.11 MIL/uL   Hemoglobin 13.4 12.0 - 15.0 g/dL   HCT 16.1 09.6 - 04.5 %   MCV 85.0 80.0 - 100.0 fL   MCH 28.6 26.0 - 34.0 pg   MCHC 33.7 30.0 - 36.0 g/dL   RDW 40.9 81.1 - 91.4 %   Platelets 320 150 - 400 K/uL   nRBC 0.0 0.0 - 0.2 %  hCG, quantitative, pregnancy     Status: Abnormal   Collection Time: 08/14/22  2:58 PM  Result Value Ref Range   hCG, Beta Chain, Quant, S 7,505 (H) <5 mIU/mL  ABO/Rh     Status: None   Collection Time: 08/14/22  2:58 PM  Result Value Ref Range   ABO/RH(D) A POS    No rh immune globuloin      NOT A RH IMMUNE GLOBULIN CANDIDATE, PT RH POSITIVE Performed at [facility], [address] [place], [place] [address]   Pregnancy, urine POC     Status: Abnormal   Collection Time: 08/14/22   3:02 PM  Result Value Ref Range   Preg Test, Ur POSITIVE (A) NEGATIVE    US OB LESS THAN 14 WEEKS WITH OB TRANSVAGINAL  Result Date: 08/14/2022 CLINICAL DATA:  [id number] Vaginal bleeding affecting early pregnancy [id number] EXAM: OBSTETRIC <14 WK [place] AND TRANSVAGINAL OB US TECHNIQUE: Both transabdominal and transvaginal ultrasound examinations were performed for complete evaluation of the gestation as well as the maternal uterus, adnexal regions, and pelvic cul-de-sac. Transvaginal technique was performed to assess early pregnancy. COMPARISON:  None Available. FINDINGS: Intrauterine gestational sac: None Maternal uterus/adnexae: Anteverted uterus. Heterogeneous, mixed echogenicity material within the endometrial canal. Endometrial complex thickness of 1.8 cm. No hyperemia of the endometrium on color Doppler. No intrauterine gestational sac. Bilateral ovaries and adnexal regions are within normal limits. No free fluid within the pelvis. IMPRESSION: 1. No intrauterine pregnancy or findings suspicious for ectopic pregnancy. Findings are consistent with pregnancy of unknown location and may reflect early intrauterine pregnancy not yet visualized sonographically, occult ectopic pregnancy, or failed pregnancy. Recommend trending of beta HCG as well as follow-up ultrasound in 7-10 days based on clinical course. 2. Heterogeneous material in the endometrial canal, likely representing blood products. Retained products of conception not excluded. Close clinical follow-up recommended. Electronically Signed   By: Duanne Guess D.O.   On: 08/14/2022 17:03    MAU Course  Procedures  MDM CBC, HCG, ABO/RH [place] Surgical pathology  On spec exam, moderate amount of dark red blood pooling. There was a large piece of what appears to be POC's protruding through the cervix which I teased out with a ring forcep. Will send for pathology. CBC stable. Hcg 7500. Blood type is A positive, Rhogam not indicated. Ultrasound with results  as above, no evidence of IUP or ectopic and some heterogenous material within the endometrial canal, likely blood products or retained POCs. I suspect patient is in the process of a miscarriage. D/w Dr. Debroah Loop and given removal of large amount of POCs and bleeding, okay to offer cytotec. I discussed expectant management vs Cytotec; patient opts for Cytotec.    Assessment and Plan   1. [redacted] weeks  gestation of pregnancy   2. Vaginal bleeding   3. Incomplete miscarriage    - Discharge home in stable condition - Rx for pain meds, antiemetics, and Cytotex sent - Return precautions given. Return to MAU as needed - F/u in 1 week at [facility], message sent   Brand Males, CNM 08/14/2022, 8:23 PM

## 2022-08-14 NOTE — MAU Note (Signed)
.  Alisha Washington is a 27 y.o. at [redacted]w[redacted]d here in MAU reporting: Vaginal bleeding as well as intermittent lower abdominal cramps. She reports she has been experiencing the cramps her entire pregnancy. She reports two days ago she began noticing light pink vaginal spotting but around 1300 today her bleeding increased and she is now wearing a pad. She is not soaking a pad. She reports passing a couple of blood clots a little smaller than a quarter. Denies vaginal itching and vaginal odors. Has not had an [place] yet. Denies urinary sx's.  Desires to establish prenatal care.  LMP: 06/10/2022 Onset of complaint: x2 days Pain score: 7/10 lower abdomen  FHT: n/a Lab orders placed from triage:  POCT Preg, UA

## 2022-08-17 ENCOUNTER — Other Ambulatory Visit: Payer: Self-pay | Admitting: Family Medicine

## 2022-08-17 DIAGNOSIS — O034 Incomplete spontaneous abortion without complication: Secondary | ICD-10-CM

## 2022-08-17 LAB — SURGICAL PATHOLOGY

## 2022-08-17 MED ORDER — ONDANSETRON 4 MG PO TBDP: 4.0000 mg | ORAL_TABLET | Freq: Three times a day (TID) | ORAL | 0 refills | Status: DC | PRN

## 2022-08-17 MED ORDER — MISOPROSTOL 200 MCG PO TABS: ORAL_TABLET | ORAL | 1 refills | Status: DC

## 2022-08-17 MED ORDER — PROMETHAZINE HCL 25 MG PO TABS: 25.0000 mg | ORAL_TABLET | Freq: Four times a day (QID) | ORAL | 0 refills | Status: DC | PRN

## 2022-08-17 MED ORDER — OXYCODONE-ACETAMINOPHEN 5-325 MG PO TABS: 1.0000 | ORAL_TABLET | Freq: Four times a day (QID) | ORAL | 0 refills | Status: AC | PRN

## 2022-08-17 MED ORDER — IBUPROFEN 600 MG PO TABS: 600.0000 mg | ORAL_TABLET | Freq: Four times a day (QID) | ORAL | 3 refills | Status: DC | PRN

## 2022-08-26 ENCOUNTER — Ambulatory Visit: Payer: Medicaid Other | Admitting: Obstetrics and Gynecology

## 2022-10-26 IMAGING — DX DG ANKLE COMPLETE 3+V*R*
3 series · 3 of 3 positions shown · non-contrast
Comparison: None Available.

CLINICAL DATA: Lateral ankle pain status post inversion injury.

EXAM:
RIGHT ANKLE - COMPLETE 3+ VIEW

[ankle ap]
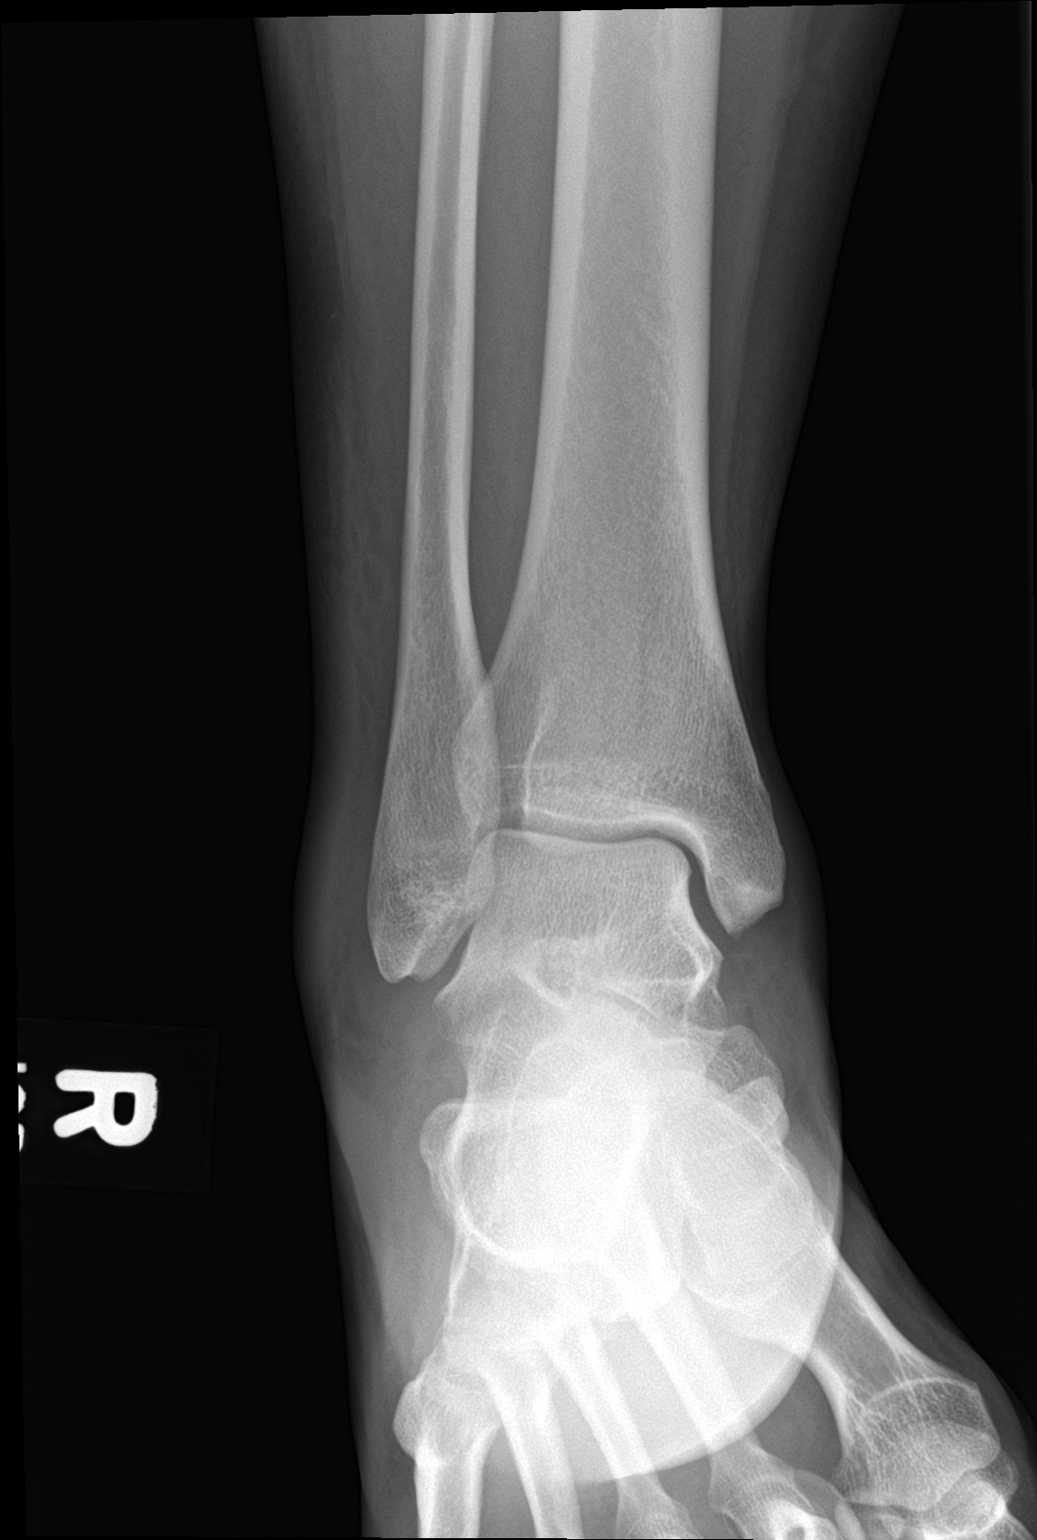

[ankle obl]
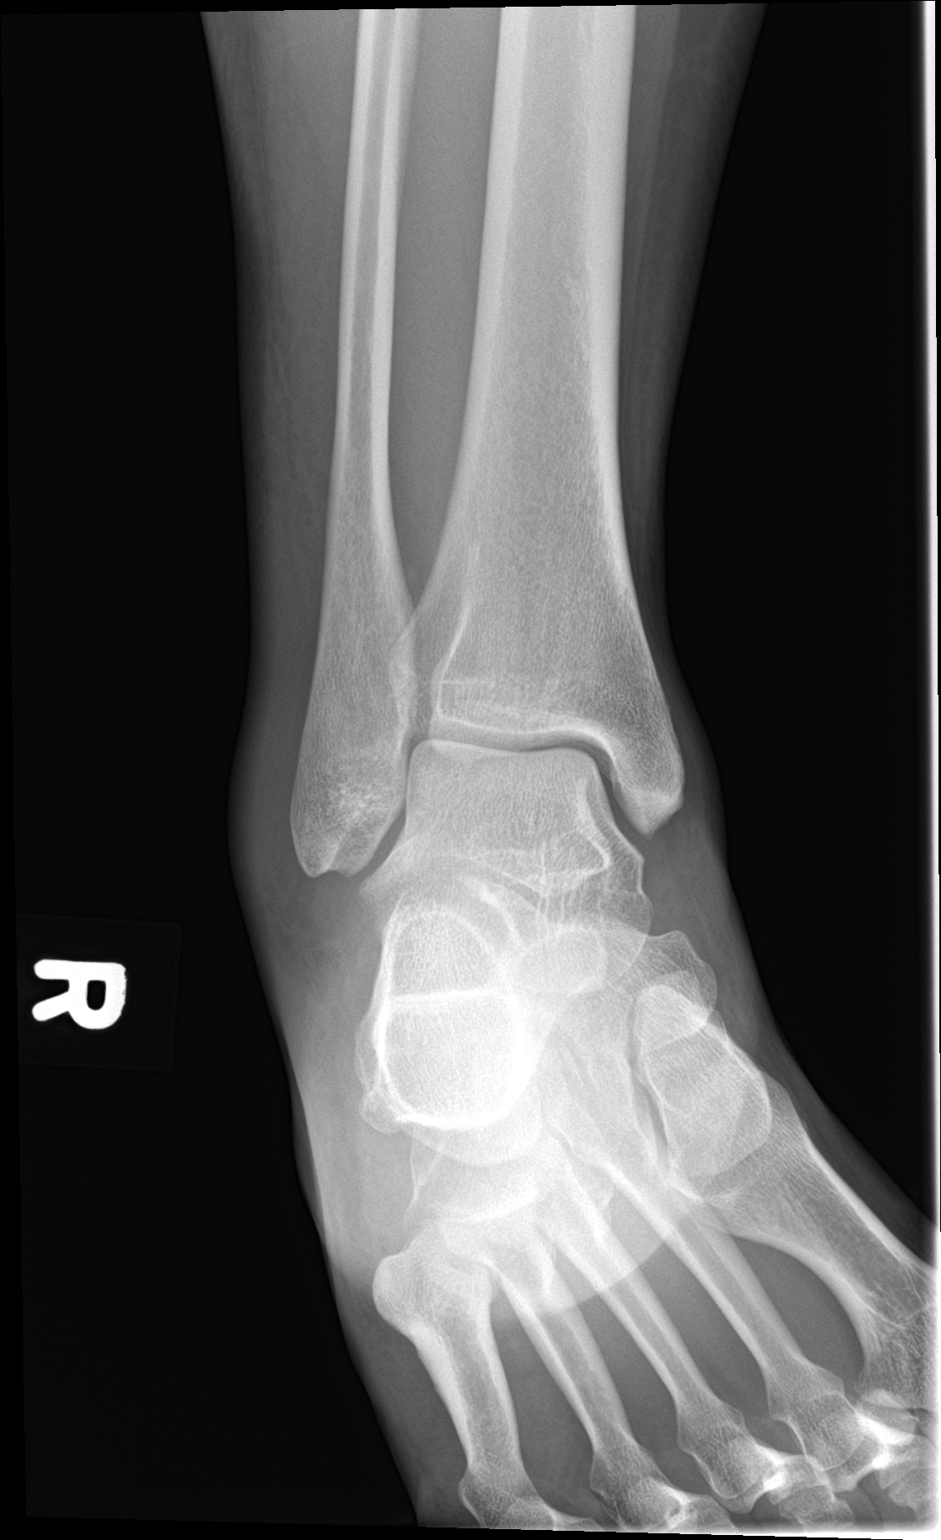

[ankle lat]
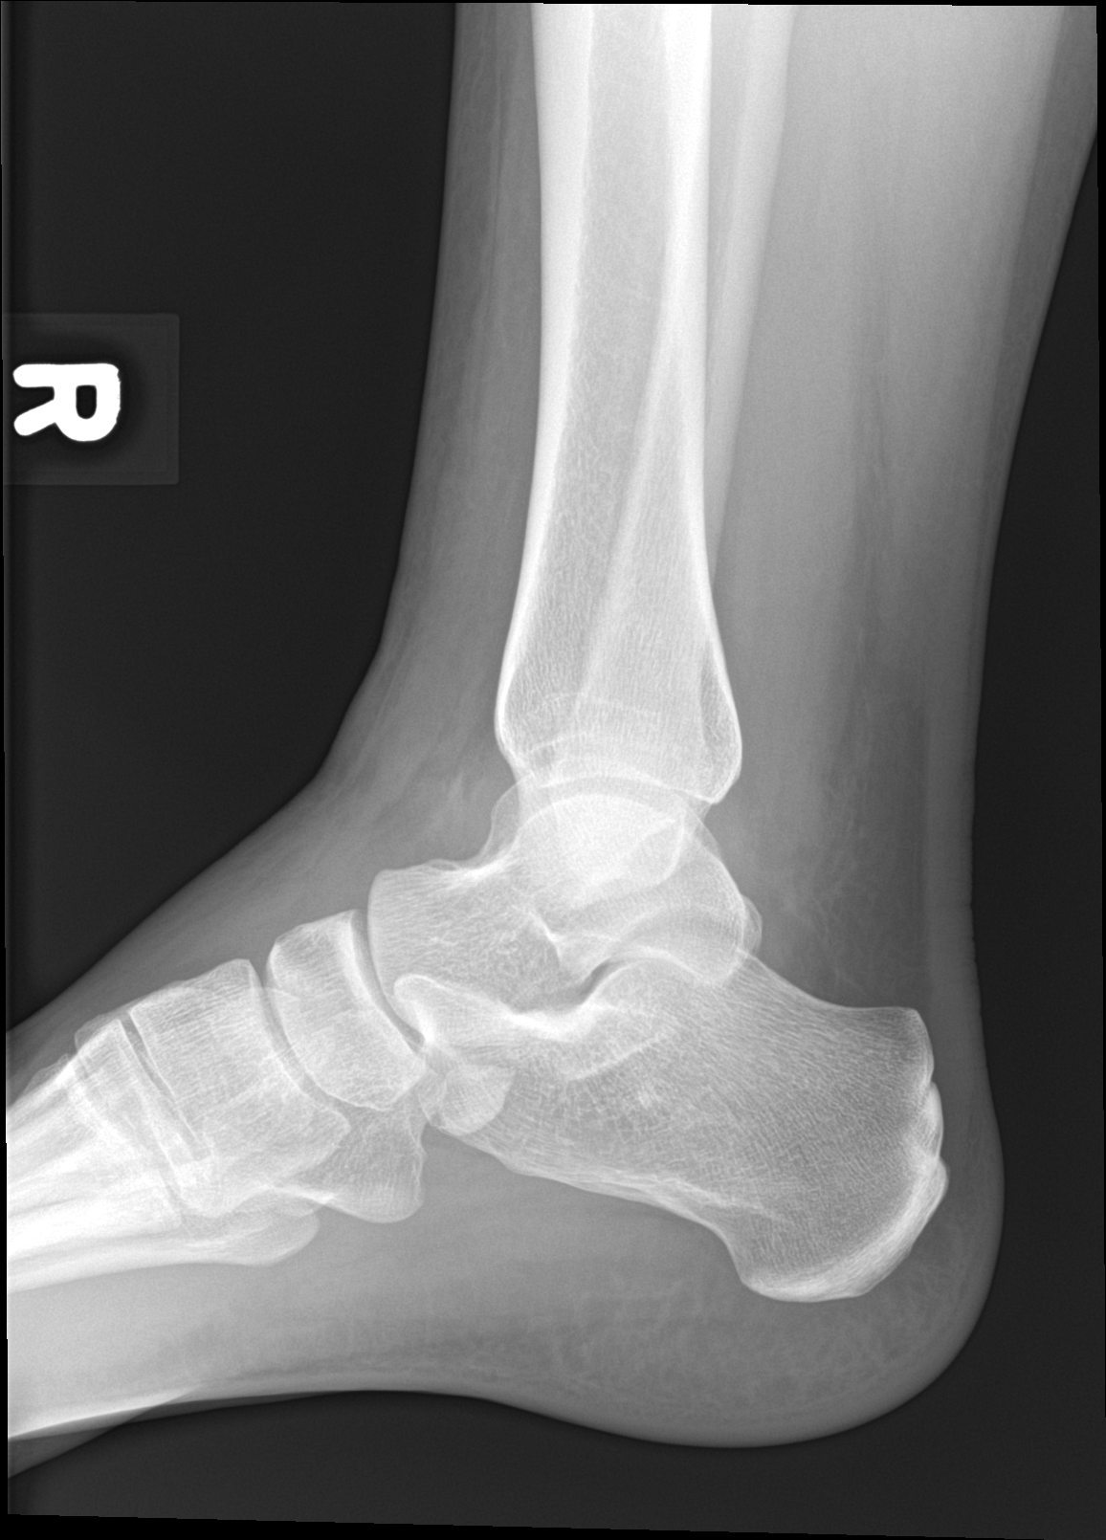

[3 of 3 positions shown; findings below may reference images not displayed]

FINDINGS: Mild soft tissue swelling overlying the lateral malleolus. No
fracture or dislocation.
IMPRESSION: Mild soft tissue swelling overlying the lateral malleolus without
underlying acute fracture or dislocation. If the patient has
continued symptoms, repeat radiographs in 10-14 days.

## 2022-11-16 ENCOUNTER — Ambulatory Visit (HOSPITAL_COMMUNITY)
Admission: EM | Admit: 2022-11-16 | Discharge: 2022-11-16 | Disposition: A | Discharge status: Home / Self Care | Payer: Medicaid Other | Attending: Emergency Medicine | Admitting: Emergency Medicine

## 2022-11-16 ENCOUNTER — Encounter (HOSPITAL_COMMUNITY): Payer: Self-pay

## 2022-11-16 DIAGNOSIS — H6121 Impacted cerumen, right ear: Secondary | ICD-10-CM | POA: Diagnosis not present

## 2022-11-16 DIAGNOSIS — H60501 Unspecified acute noninfective otitis externa, right ear: Secondary | ICD-10-CM | POA: Diagnosis not present

## 2022-11-16 MED ORDER — OFLOXACIN 0.3 % OT SOLN: 5.0000 [drp] | Freq: Every day | OTIC | 0 refills | Status: AC

## 2022-11-16 NOTE — ED Triage Notes (Signed)
Pt states right ear pain for the past 3 days.

## 2022-11-16 NOTE — ED Provider Notes (Signed)
[facility]    CSN: [id number] Arrival date & time: 11/16/22  1041      History   Chief Complaint Chief Complaint  Patient presents with   Otalgia    HPI Alisha Washington is a 27 y.o. female.   Patient presents with right ear pain and pressure x 3 days.  Denies fever, headache, blurred vision, and dizziness.   Otalgia Associated symptoms: no congestion, no ear discharge, no fever and no headaches     Past Medical History:  Diagnosis Date   Deliberate self-cutting    Panic attacks    Scoliosis    Seasonal allergies     Patient Active Problem List   Diagnosis Date Noted   Bipolar 1 disorder, depressed, severe (HCC) 02/19/2022   GAD (generalized anxiety disorder) 02/19/2022   PTSD (post-traumatic stress disorder) 02/19/2022   Cannabis abuse 02/19/2022   Severe recurrent major depression (HCC) 04/06/2021   Severe recurrent major depression without psychotic features (HCC) 11/21/2016   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   Oppositional defiant disorder 03/19/2015   Alcohol abuse 03/19/2015    History reviewed. No pertinent surgical history.  OB History     Gravida  1   Para      Term      Preterm      AB      Living         SAB      IAB      Ectopic      Multiple      Live Births               Home Medications    Prior to Admission medications   Medication Sig Start Date End Date Taking? Authorizing Provider  ofloxacin (FLOXIN) 0.3 % OTIC solution Place 5 drops into the right ear daily for 5 days. 11/16/22 11/21/22 Yes Deatra Mcmahen A, NP  ibuprofen (ADVIL) 600 MG tablet Take 1 tablet (600 mg total) by mouth every 6 (six) hours as needed. 08/17/22   Federico Flake, MD  promethazine (PHENERGAN) 25 MG tablet Take 1 tablet (25 mg total) by mouth every 6 (six) hours as needed for nausea or vomiting. 08/17/22   Federico Flake, MD    Family History Family History  Problem Relation Age of Onset    Diabetes Father     Social History Social History   Tobacco Use   Smoking status: Some Days    Types: Cigars   Smokeless tobacco: Never  Vaping Use   Vaping status: Every Day   Substances: Nicotine  Substance Use Topics   Alcohol use: No   Drug use: Yes    Types: Marijuana     Allergies   Patient has no known allergies.   Review of Systems Review of Systems  Constitutional:  Negative for chills, fatigue and fever.  HENT:  Positive for ear pain. Negative for congestion and ear discharge.   Neurological:  Negative for dizziness and headaches.     Physical Exam Triage Vital Signs ED Triage Vitals  Encounter Vitals Group     BP 11/16/22 1119 112/62     Systolic BP Percentile --      Diastolic BP Percentile --      Pulse Rate 11/16/22 1119 75     Resp 11/16/22 1119 16     Temp 11/16/22 1119 98 F (36.7 C)     Temp Source 11/16/22 1119 Oral     SpO2 11/16/22  1119 98 %     Weight --      Height --      Head Circumference --      Peak Flow --      Pain Score 11/16/22 1120 8     Pain Loc --      Pain Education --      Exclude from Growth Chart --    No data found.  Updated Vital Signs BP 112/62 (BP Location: Left Arm)   Pulse 75   Temp 98 F (36.7 C) (Oral)   Resp 16   LMP 11/15/2022 (Exact Date)   SpO2 98%   Breastfeeding No   Visual Acuity Right Eye Distance:   Left Eye Distance:   Bilateral Distance:    Right Eye Near:   Left Eye Near:    Bilateral Near:     Physical Exam Vitals and nursing note reviewed.  Constitutional:      General: She is not in acute distress.    Appearance: Normal appearance. She is not ill-appearing, toxic-appearing or diaphoretic.  HENT:     Right Ear: There is impacted cerumen.     Left Ear: Tympanic membrane, ear canal and external ear normal.  Neurological:     Mental Status: She is alert.      UC Treatments / Results  Labs (all labs ordered are listed, but only abnormal results are displayed) Labs  Reviewed - No data to display  EKG   Radiology No results found.  Procedures Procedures (including critical care time)  Medications Ordered in UC Medications - No data to display  Initial Impression / Assessment and Plan / UC Course  I have reviewed the triage vital signs and the nursing notes.  Pertinent labs & imaging results that were available during my care of the patient were reviewed by me and considered in my medical decision making (see chart for details).     Patient presented with 3-day history of right ear pain and pressure.  Upon assessment patient has impacted cerumen to right ear.  Upon reassessment after ear irrigation, patient has mild erythema and swelling to right ear canal.  Prescribed ofloxacin for otitis externa.  Discussed follow-up and return precautions. Final Clinical Impressions(s) / UC Diagnoses   Final diagnoses:  Impacted cerumen of right ear  Acute otitis externa of right ear, unspecified type     Discharge Instructions      Use ofloxacin eardrops once daily for 5 days for ear canal infection coverage.  Return here as needed.     ED Prescriptions     Medication Sig Dispense Auth. Provider   ofloxacin (FLOXIN) 0.3 % OTIC solution Place 5 drops into the right ear daily for 5 days. 5 mL Wynonia Lawman A, NP      PDMP not reviewed this encounter.   Wynonia Lawman A, NP 11/16/22 1141

## 2022-11-16 NOTE — Discharge Instructions (Signed)
Use ofloxacin eardrops once daily for 5 days for ear canal infection coverage.  Return here as needed.

## 2022-12-28 ENCOUNTER — Telehealth: Payer: 59 | Admitting: Family Medicine

## 2022-12-28 DIAGNOSIS — B9789 Other viral agents as the cause of diseases classified elsewhere: Secondary | ICD-10-CM

## 2022-12-28 DIAGNOSIS — J329 Chronic sinusitis, unspecified: Secondary | ICD-10-CM

## 2022-12-28 MED ORDER — FLUTICASONE PROPIONATE 50 MCG/ACT NA SUSP: 2.0000 | Freq: Every day | NASAL | 0 refills | Status: DC

## 2022-12-28 NOTE — Progress Notes (Signed)
E-Visit for Sinus Problems  We are sorry that you are not feeling well.  Here is how we plan to help!  Based on what you have shared with me it looks like you have sinusitis.  Sinusitis is inflammation and infection in the sinus cavities of the head.  Based on your presentation I believe you most likely have Acute Viral Sinusitis.This is an infection most likely caused by a virus. There is not specific treatment for viral sinusitis other than to help you with the symptoms until the infection runs its course.  You may use an oral decongestant such as Mucinex D or if you have glaucoma or high blood pressure use plain Mucinex. Saline nasal spray help and can safely be used as often as needed for congestion, I have prescribed: Fluticasone nasal spray two sprays in each nostril once a day  Some authorities believe that zinc sprays or the use of Echinacea may shorten the course of your symptoms.  Sinus infections are not as easily transmitted as other respiratory infection, however we still recommend that you avoid close contact with loved ones, especially the very young and elderly.  Remember to wash your hands thoroughly throughout the day as this is the number one way to prevent the spread of infection!  Home Care: Only take medications as instructed by your medical team. Do not take these medications with alcohol. A steam or ultrasonic humidifier can help congestion.  You can place a towel over your head and breathe in the steam from hot water coming from a faucet. Avoid close contacts especially the very young and the elderly. Cover your mouth when you cough or sneeze. Always remember to wash your hands.  Get Help Right Away If: You develop worsening fever or sinus pain. You develop a severe head ache or visual changes. Your symptoms persist after you have completed your treatment plan.  Make sure you Understand these instructions. Will watch your condition. Will get help right away if you  are not doing well or get worse.   Thank you for choosing an e-visit.  Your e-visit answers were reviewed by a board certified advanced clinical practitioner to complete your personal care plan. Depending upon the condition, your plan could have included both over the counter or prescription medications.  Please review your pharmacy choice. Make sure the pharmacy is open so you can pick up prescription now. If there is a problem, you may contact your provider through MyChart messaging and have the prescription routed to another pharmacy.  Your safety is important to us. If you have drug allergies check your prescription carefully.   For the next 24 hours you can use MyChart to ask questions about today's visit, request a non-urgent call back, or ask for a work or school excuse. You will get an email in the next two days asking about your experience. I hope that your e-visit has been valuable and will speed your recovery.  I provided 5 minutes of non face-to-face time during this encounter for chart review, medication and order placement, as well as and documentation.   

## 2023-12-27 ENCOUNTER — Ambulatory Visit (HOSPITAL_COMMUNITY)
Admission: EM | Admit: 2023-12-27 | Discharge: 2023-12-27 | Disposition: A | Discharge status: Home / Self Care | Attending: Emergency Medicine | Admitting: Emergency Medicine

## 2023-12-27 ENCOUNTER — Other Ambulatory Visit: Payer: Self-pay

## 2023-12-27 ENCOUNTER — Encounter (HOSPITAL_COMMUNITY): Payer: Self-pay | Admitting: *Deleted

## 2023-12-27 DIAGNOSIS — M25561 Pain in right knee: Secondary | ICD-10-CM

## 2023-12-27 MED ORDER — IBUPROFEN 800 MG PO TABS: 800.0000 mg | ORAL_TABLET | Freq: Three times a day (TID) | ORAL | 0 refills | Status: AC

## 2023-12-27 NOTE — ED Triage Notes (Signed)
 PT reports she has had RT knee pain for one week . Pt thinks she may have strained the RT knee at work. Pt has taken any OTC for pain.

## 2023-12-27 NOTE — Discharge Instructions (Signed)
 Wear the knee sleeve to help provide compression and support.  Invest in more supportive shoes, consider over-the-counter inserts such as Dr. Heriberto as well.  Take ibuprofen  every 8 hours to help with pain and inflammation.  Icing and elevating may help with pain and any swelling.  If knee pain persists return to clinic or follow-up with orthopedic for reevaluation.

## 2023-12-27 NOTE — ED Provider Notes (Signed)
 [facility]    CSN: [id number] Arrival date & time: 12/27/23  1329      History   Chief Complaint Chief Complaint  Patient presents with   Knee Pain    HPI Alisha Washington is a 28 y.o. female.   Patient presents to clinic over concern of right knee pain for about a week now.  Noticed the problem after standing for prolonged period at work and shifting weight on her knee.  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  Has tried Tylenol  without much improvement.  Has not had any trauma, falls or injuries.  Denies swelling.  Ambulatory.  The history is provided by the patient and medical records.  Knee Pain   Past Medical History:  Diagnosis Date   Deliberate self-cutting    Panic attacks    Scoliosis    Seasonal allergies     Patient Active Problem List   Diagnosis Date Noted   Bipolar 1 disorder, depressed, severe (HCC) 02/19/2022   GAD (generalized anxiety disorder) 02/19/2022   PTSD (post-traumatic stress disorder) 02/19/2022   Cannabis abuse 02/19/2022   Severe recurrent major depression (HCC) 04/06/2021   Severe recurrent major depression without psychotic features (HCC) 11/21/2016   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   Oppositional defiant disorder 03/19/2015   Alcohol abuse 03/19/2015    History reviewed. No pertinent surgical history.  OB History     Gravida  1   Para      Term      Preterm      AB      Living         SAB      IAB      Ectopic      Multiple      Live Births               Home Medications    Prior to Admission medications   Medication Sig Start Date End Date Taking? Authorizing Provider  ibuprofen  (ADVIL ) 800 MG tablet Take 1 tablet (800 mg total) by mouth 3 (three) times daily. 12/27/23  Yes Dreama, Darel Ricketts  N, FNP    Family History Family History  Problem Relation Age of Onset   Diabetes Father     Social History Social History   Tobacco Use    Smoking status: Some Days    Types: Cigars   Smokeless tobacco: Never  Vaping Use   Vaping status: Every Day   Substances: Nicotine   Substance Use Topics   Alcohol use: No   Drug use: Yes    Types: Marijuana     Allergies   Patient has no known allergies.   Review of Systems Review of Systems  Per HPI  Physical Exam Triage Vital Signs ED Triage Vitals  Encounter Vitals Group     BP 12/27/23 1400 118/67     Girls Systolic BP Percentile --      Girls Diastolic BP Percentile --      Boys Systolic BP Percentile --      Boys Diastolic BP Percentile --      Pulse Rate 12/27/23 1400 78     Resp 12/27/23 1400 18     Temp 12/27/23 1400 98 F (36.7 C)     Temp src --      SpO2 12/27/23 1400 97 %     Weight --      Height --  Head Circumference --      Peak Flow --      Pain Score 12/27/23 1358 5     Pain Loc --      Pain Education --      Exclude from Growth Chart --    No data found.  Updated Vital Signs BP 118/67   Pulse 78   Temp 98 F (36.7 C)   Resp 18   LMP 12/19/2023 (Approximate)   SpO2 97%   Visual Acuity Right Eye Distance:   Left Eye Distance:   Bilateral Distance:    Right Eye Near:   Left Eye Near:    Bilateral Near:     Physical Exam Vitals and nursing note reviewed.  Constitutional:      Appearance: Normal appearance.  HENT:     Head: Normocephalic and atraumatic.     Right Ear: External ear normal.     Left Ear: External ear normal.     Nose: Nose normal.     Mouth/Throat:     Mouth: Mucous membranes are moist.  Eyes:     Conjunctiva/sclera: Conjunctivae normal.  Cardiovascular:     Rate and Rhythm: Normal rate.  Pulmonary:     Effort: Pulmonary effort is normal. No respiratory distress.  Musculoskeletal:        General: Tenderness present. No swelling or signs of injury. Normal range of motion.     Right knee: No swelling, deformity, effusion, erythema or ecchymosis. Normal range of motion.     Instability Tests:  Anterior drawer test negative. Posterior drawer test negative.       Legs:     Comments: Bilateral posterior right knee tenderness to palpation, without swelling, deformity, bruising or crepitus.  Skin:    General: Skin is warm and dry.  Neurological:     General: No focal deficit present.     Mental Status: She is alert and oriented to person, place, and time.  Psychiatric:        Behavior: Behavior normal.      UC Treatments / Results  Labs (all labs ordered are listed, but only abnormal results are displayed) Labs Reviewed - No data to display  EKG   Radiology No results found.  Procedures Procedures (including critical care time)  Medications Ordered in UC Medications - No data to display  Initial Impression / Assessment and Plan / UC Course  I have reviewed the triage vital signs and the nursing notes.  Pertinent labs & imaging results that were available during my care of the patient were reviewed by me and considered in my medical decision making (see chart for details).  Vitals and triage reviewed, patient is hemodynamically stable.  Right knee with full range of motion.  Atraumatic, imaging deferred.  Without bruising, swelling or crepitus.  Mild posterior tenderness bilaterally.  Suspect knee pain due to prolonged standing.  Will trial compression and anti-inflammatories.  Encouraged proper footwear.  Plan of care, follow-up care return precautions given, no questions at this time.  Work note provided.    Final Clinical Impressions(s) / UC Diagnoses   Final diagnoses:  Acute pain of right knee     Discharge Instructions      Wear the knee sleeve to help provide compression and support.  Invest in more supportive shoes, consider over-the-counter inserts such as Dr. Heriberto as well.  Take ibuprofen  every 8 hours to help with pain and inflammation.  Icing and elevating may help with pain and any swelling.  If knee pain persists return to clinic or  follow-up with orthopedic for reevaluation.    ED Prescriptions     Medication Sig Dispense Auth. Provider   ibuprofen  (ADVIL ) 800 MG tablet Take 1 tablet (800 mg total) by mouth 3 (three) times daily. 30 tablet [name], FNP      PDMP not reviewed this encounter.   Dreama Lupe SAILOR, FNP 12/27/23 1435
